# Patient Record
Sex: Male | Born: 1970 | Race: White | Hispanic: No | Marital: Single | Smoking: Current every day smoker
Health system: Southern US, Community
[De-identification: ages and names within clinical notes are randomized; demographics above are authoritative.]

## PROBLEM LIST (undated history)

## (undated) DIAGNOSIS — Z21 Asymptomatic human immunodeficiency virus [HIV] infection status: Secondary | ICD-10-CM

## (undated) DIAGNOSIS — R011 Cardiac murmur, unspecified: Secondary | ICD-10-CM

## (undated) DIAGNOSIS — E7800 Pure hypercholesterolemia, unspecified: Secondary | ICD-10-CM

## (undated) DIAGNOSIS — B20 Human immunodeficiency virus [HIV] disease: Secondary | ICD-10-CM

## (undated) HISTORY — PX: APPENDECTOMY: SHX54

---

## 2001-11-14 ENCOUNTER — Emergency Department (HOSPITAL_COMMUNITY): Admission: EM | Admit: 2001-11-14 | Discharge: 2001-11-14 | Payer: Self-pay | Admitting: Emergency Medicine

## 2002-08-07 ENCOUNTER — Emergency Department (HOSPITAL_COMMUNITY): Admission: EM | Admit: 2002-08-07 | Discharge: 2002-08-07 | Payer: Self-pay | Admitting: Emergency Medicine

## 2003-02-02 ENCOUNTER — Encounter (INDEPENDENT_AMBULATORY_CARE_PROVIDER_SITE_OTHER): Payer: Self-pay | Admitting: Infectious Diseases

## 2003-02-02 ENCOUNTER — Encounter: Admission: RE | Admit: 2003-02-02 | Discharge: 2003-02-02 | Payer: Self-pay | Admitting: Infectious Diseases

## 2003-02-02 ENCOUNTER — Ambulatory Visit (HOSPITAL_COMMUNITY): Admission: RE | Admit: 2003-02-02 | Discharge: 2003-02-02 | Payer: Self-pay | Admitting: Infectious Diseases

## 2003-02-02 ENCOUNTER — Encounter (INDEPENDENT_AMBULATORY_CARE_PROVIDER_SITE_OTHER): Payer: Self-pay | Admitting: *Deleted

## 2003-02-02 LAB — CONVERTED CEMR LAB
CD4 Count: 200 microliters
CD4 T Cell Abs: 200

## 2003-03-07 ENCOUNTER — Ambulatory Visit (HOSPITAL_COMMUNITY): Admission: RE | Admit: 2003-03-07 | Discharge: 2003-03-07 | Payer: Self-pay | Admitting: Infectious Diseases

## 2003-03-07 ENCOUNTER — Encounter (INDEPENDENT_AMBULATORY_CARE_PROVIDER_SITE_OTHER): Payer: Self-pay | Admitting: Infectious Diseases

## 2003-03-07 ENCOUNTER — Encounter: Admission: RE | Admit: 2003-03-07 | Discharge: 2003-03-07 | Payer: Self-pay | Admitting: Infectious Diseases

## 2003-03-22 ENCOUNTER — Encounter: Admission: RE | Admit: 2003-03-22 | Discharge: 2003-03-22 | Payer: Self-pay | Admitting: Infectious Diseases

## 2003-05-23 ENCOUNTER — Encounter: Admission: RE | Admit: 2003-05-23 | Discharge: 2003-05-23 | Payer: Self-pay | Admitting: Infectious Diseases

## 2003-07-19 ENCOUNTER — Ambulatory Visit (HOSPITAL_COMMUNITY): Admission: RE | Admit: 2003-07-19 | Discharge: 2003-07-19 | Payer: Self-pay | Admitting: Infectious Diseases

## 2003-07-19 ENCOUNTER — Encounter: Admission: RE | Admit: 2003-07-19 | Discharge: 2003-07-19 | Payer: Self-pay | Admitting: Infectious Diseases

## 2003-08-03 ENCOUNTER — Encounter: Admission: RE | Admit: 2003-08-03 | Discharge: 2003-08-03 | Payer: Self-pay | Admitting: Infectious Diseases

## 2003-09-12 ENCOUNTER — Encounter: Admission: RE | Admit: 2003-09-12 | Discharge: 2003-09-12 | Payer: Self-pay | Admitting: Infectious Diseases

## 2003-10-31 ENCOUNTER — Encounter: Admission: RE | Admit: 2003-10-31 | Discharge: 2003-10-31 | Payer: Self-pay | Admitting: Infectious Diseases

## 2003-10-31 ENCOUNTER — Ambulatory Visit (HOSPITAL_COMMUNITY): Admission: RE | Admit: 2003-10-31 | Discharge: 2003-10-31 | Payer: Self-pay | Admitting: Infectious Diseases

## 2003-11-14 ENCOUNTER — Encounter: Admission: RE | Admit: 2003-11-14 | Discharge: 2003-11-14 | Payer: Self-pay | Admitting: Infectious Diseases

## 2004-02-28 ENCOUNTER — Ambulatory Visit (HOSPITAL_COMMUNITY): Admission: RE | Admit: 2004-02-28 | Discharge: 2004-02-28 | Payer: Self-pay | Admitting: Infectious Diseases

## 2004-02-28 ENCOUNTER — Ambulatory Visit: Payer: Self-pay | Admitting: Internal Medicine

## 2004-03-13 ENCOUNTER — Ambulatory Visit: Payer: Self-pay | Admitting: Infectious Diseases

## 2004-06-05 ENCOUNTER — Ambulatory Visit (HOSPITAL_COMMUNITY): Admission: RE | Admit: 2004-06-05 | Discharge: 2004-06-05 | Payer: Self-pay | Admitting: Infectious Diseases

## 2004-06-05 ENCOUNTER — Ambulatory Visit: Payer: Self-pay | Admitting: Infectious Diseases

## 2004-06-19 ENCOUNTER — Ambulatory Visit: Payer: Self-pay | Admitting: Infectious Diseases

## 2004-12-05 ENCOUNTER — Ambulatory Visit: Payer: Self-pay | Admitting: Infectious Diseases

## 2004-12-05 ENCOUNTER — Ambulatory Visit (HOSPITAL_COMMUNITY): Admission: RE | Admit: 2004-12-05 | Discharge: 2004-12-05 | Payer: Self-pay | Admitting: Infectious Diseases

## 2004-12-26 ENCOUNTER — Ambulatory Visit: Payer: Self-pay | Admitting: Infectious Diseases

## 2005-01-09 ENCOUNTER — Ambulatory Visit: Payer: Self-pay | Admitting: Infectious Diseases

## 2005-01-22 ENCOUNTER — Ambulatory Visit: Payer: Self-pay | Admitting: Infectious Diseases

## 2005-04-17 ENCOUNTER — Ambulatory Visit (HOSPITAL_COMMUNITY): Admission: RE | Admit: 2005-04-17 | Discharge: 2005-04-17 | Payer: Self-pay | Admitting: Infectious Diseases

## 2005-04-17 ENCOUNTER — Encounter (INDEPENDENT_AMBULATORY_CARE_PROVIDER_SITE_OTHER): Payer: Self-pay | Admitting: *Deleted

## 2005-04-17 ENCOUNTER — Ambulatory Visit: Payer: Self-pay | Admitting: Infectious Diseases

## 2005-04-17 LAB — CONVERTED CEMR LAB
CD4 Count: 470 microliters
HIV 1 RNA Quant: 49 copies/mL

## 2005-05-06 ENCOUNTER — Ambulatory Visit: Payer: Self-pay | Admitting: Infectious Diseases

## 2005-10-21 ENCOUNTER — Encounter: Admission: RE | Admit: 2005-10-21 | Discharge: 2005-10-21 | Payer: Self-pay | Admitting: Infectious Diseases

## 2005-10-21 ENCOUNTER — Ambulatory Visit: Payer: Self-pay | Admitting: Infectious Diseases

## 2005-10-21 ENCOUNTER — Encounter (INDEPENDENT_AMBULATORY_CARE_PROVIDER_SITE_OTHER): Payer: Self-pay | Admitting: *Deleted

## 2005-10-21 LAB — CONVERTED CEMR LAB
CD4 Count: 350 microliters
HIV 1 RNA Quant: 49 copies/mL

## 2005-11-04 ENCOUNTER — Ambulatory Visit: Payer: Self-pay | Admitting: Infectious Diseases

## 2006-03-04 ENCOUNTER — Encounter: Admission: RE | Admit: 2006-03-04 | Discharge: 2006-03-04 | Payer: Self-pay | Admitting: Infectious Diseases

## 2006-03-04 ENCOUNTER — Encounter (INDEPENDENT_AMBULATORY_CARE_PROVIDER_SITE_OTHER): Payer: Self-pay | Admitting: *Deleted

## 2006-03-04 ENCOUNTER — Ambulatory Visit: Payer: Self-pay | Admitting: Infectious Diseases

## 2006-03-04 LAB — CONVERTED CEMR LAB
ALT: 13 units/L (ref 0–53)
AST: 13 units/L (ref 0–37)
Albumin: 5 g/dL (ref 3.5–5.2)
Alkaline Phosphatase: 61 units/L (ref 39–117)
BUN: 8 mg/dL (ref 6–23)
Basophils Absolute: 0 10*3/uL (ref 0.0–0.1)
Basophils Relative: 1 % (ref 0–1)
CD4 Count: 460 microliters
CO2: 25 meq/L (ref 19–32)
Calcium: 9.4 mg/dL (ref 8.4–10.5)
Chloride: 102 meq/L (ref 96–112)
Cholesterol: 245 mg/dL — ABNORMAL HIGH (ref 0–200)
Creatinine, Ser: 0.98 mg/dL (ref 0.40–1.50)
Eosinophils Relative: 2 % (ref 0–4)
Glucose, Bld: 91 mg/dL (ref 70–99)
HCT: 45.9 % (ref 41.0–49.0)
HDL: 34 mg/dL — ABNORMAL LOW (ref >39)
HIV 1 RNA Quant: 152 copies/mL
HIV 1 RNA Quant: 152 copies/mL — ABNORMAL HIGH (ref <50)
HIV-1 RNA Quant, Log: 2.18 — ABNORMAL HIGH (ref <1.70)
Hemoglobin: 15.5 g/dL (ref 13.9–16.8)
LDL Cholesterol: 157 mg/dL — ABNORMAL HIGH (ref 0–99)
Lymphocytes Relative: 32 % (ref 15–43)
Lymphs Abs: 1.3 10*3/uL (ref 0.8–3.1)
MCHC: 33.8 g/dL (ref 33.1–35.4)
MCV: 92 fL (ref 78.8–100.0)
Monocytes Absolute: 0.5 10*3/uL (ref 0.2–0.7)
Monocytes Relative: 12 % — ABNORMAL HIGH (ref 3–11)
Neutro Abs: 2.2 10*3/uL (ref 1.8–6.8)
Neutrophils Relative %: 54 % (ref 47–77)
Platelets: 128 10*3/uL — ABNORMAL LOW (ref 152–374)
Potassium: 5 meq/L (ref 3.5–5.3)
RBC: 4.99 M/uL (ref 4.20–5.50)
RDW: 13.2 % (ref 11.5–15.3)
Sodium: 136 meq/L (ref 135–145)
Total Bilirubin: 0.5 mg/dL (ref 0.3–1.2)
Total CHOL/HDL Ratio: 7.2
Total Protein: 7.8 g/dL (ref 6.0–8.3)
Triglycerides: 271 mg/dL — ABNORMAL HIGH (ref <150)
VLDL: 54 mg/dL — ABNORMAL HIGH (ref 0–40)
WBC: 4.1 10*3/uL (ref 3.7–10.0)

## 2006-03-19 ENCOUNTER — Ambulatory Visit: Payer: Self-pay | Admitting: Internal Medicine

## 2006-04-14 DIAGNOSIS — E781 Pure hyperglyceridemia: Secondary | ICD-10-CM

## 2006-04-14 DIAGNOSIS — B20 Human immunodeficiency virus [HIV] disease: Secondary | ICD-10-CM

## 2006-04-14 DIAGNOSIS — L509 Urticaria, unspecified: Secondary | ICD-10-CM | POA: Insufficient documentation

## 2006-05-01 ENCOUNTER — Encounter (INDEPENDENT_AMBULATORY_CARE_PROVIDER_SITE_OTHER): Payer: Self-pay | Admitting: Infectious Diseases

## 2006-06-02 ENCOUNTER — Encounter (INDEPENDENT_AMBULATORY_CARE_PROVIDER_SITE_OTHER): Payer: Self-pay | Admitting: *Deleted

## 2006-06-02 LAB — CONVERTED CEMR LAB

## 2006-06-15 ENCOUNTER — Encounter (INDEPENDENT_AMBULATORY_CARE_PROVIDER_SITE_OTHER): Payer: Self-pay | Admitting: *Deleted

## 2006-07-07 ENCOUNTER — Ambulatory Visit: Payer: Self-pay | Admitting: Infectious Diseases

## 2006-07-07 ENCOUNTER — Encounter: Admission: RE | Admit: 2006-07-07 | Discharge: 2006-07-07 | Payer: Self-pay | Admitting: Infectious Diseases

## 2006-07-29 ENCOUNTER — Ambulatory Visit: Payer: Self-pay | Admitting: Infectious Diseases

## 2006-08-04 ENCOUNTER — Ambulatory Visit: Payer: Self-pay | Admitting: Infectious Diseases

## 2006-08-04 LAB — CONVERTED CEMR LAB

## 2006-08-27 ENCOUNTER — Ambulatory Visit: Payer: Self-pay | Admitting: Infectious Diseases

## 2006-12-24 ENCOUNTER — Encounter: Admission: RE | Admit: 2006-12-24 | Discharge: 2006-12-24 | Payer: Self-pay | Admitting: Infectious Disease

## 2006-12-24 ENCOUNTER — Ambulatory Visit: Payer: Self-pay | Admitting: Infectious Disease

## 2006-12-24 ENCOUNTER — Telehealth: Payer: Self-pay | Admitting: Infectious Disease

## 2006-12-24 LAB — CONVERTED CEMR LAB
ALT: 17 units/L (ref 0–53)
AST: 16 units/L (ref 0–37)
Albumin: 5.2 g/dL (ref 3.5–5.2)
Alkaline Phosphatase: 62 units/L (ref 39–117)
BUN: 13 mg/dL (ref 6–23)
Basophils Absolute: 0 10*3/uL (ref 0.0–0.1)
Basophils Relative: 0 % (ref 0–1)
CO2: 23 meq/L (ref 19–32)
Calcium: 9.4 mg/dL (ref 8.4–10.5)
Chloride: 102 meq/L (ref 96–112)
Creatinine, Ser: 0.9 mg/dL (ref 0.40–1.50)
Eosinophils Absolute: 0.1 10*3/uL (ref 0.0–0.7)
Eosinophils Relative: 2 % (ref 0–5)
Glucose, Bld: 96 mg/dL (ref 70–99)
HCT: 45.1 % (ref 39.0–52.0)
HIV 1 RNA Quant: <50 copies/mL (ref <50)
HIV-1 RNA Quant, Log: <1.7 (ref <1.70)
Hemoglobin: 15.7 g/dL (ref 13.0–17.0)
Lymphocytes Relative: 26 % (ref 12–46)
Lymphs Abs: 1.6 10*3/uL (ref 0.7–3.3)
MCHC: 34.8 g/dL (ref 30.0–36.0)
MCV: 90.4 fL (ref 78.0–100.0)
Monocytes Absolute: 0.5 10*3/uL (ref 0.2–0.7)
Monocytes Relative: 7 % (ref 3–11)
Neutro Abs: 4.1 10*3/uL (ref 1.7–7.7)
Neutrophils Relative %: 65 % (ref 43–77)
Platelets: 135 10*3/uL — ABNORMAL LOW (ref 150–400)
Potassium: 4.6 meq/L (ref 3.5–5.3)
RBC: 4.99 M/uL (ref 4.22–5.81)
RDW: 13 % (ref 11.5–14.0)
Sodium: 139 meq/L (ref 135–145)
Total Bilirubin: 0.7 mg/dL (ref 0.3–1.2)
Total Protein: 8 g/dL (ref 6.0–8.3)
WBC: 6.2 10*3/uL (ref 4.0–10.5)

## 2007-01-08 ENCOUNTER — Ambulatory Visit: Payer: Self-pay | Admitting: Infectious Disease

## 2007-01-08 DIAGNOSIS — F17200 Nicotine dependence, unspecified, uncomplicated: Secondary | ICD-10-CM

## 2007-06-01 ENCOUNTER — Telehealth (INDEPENDENT_AMBULATORY_CARE_PROVIDER_SITE_OTHER): Payer: Self-pay | Admitting: *Deleted

## 2007-07-21 ENCOUNTER — Ambulatory Visit: Payer: Self-pay | Admitting: Infectious Disease

## 2007-07-21 ENCOUNTER — Encounter: Admission: RE | Admit: 2007-07-21 | Discharge: 2007-07-21 | Payer: Self-pay | Admitting: Infectious Disease

## 2007-07-21 LAB — CONVERTED CEMR LAB
ALT: 21 units/L (ref 0–53)
AST: 19 units/L (ref 0–37)
Albumin: 5.1 g/dL (ref 3.5–5.2)
Alkaline Phosphatase: 65 units/L (ref 39–117)
BUN: 13 mg/dL (ref 6–23)
Basophils Absolute: 0 10*3/uL (ref 0.0–0.1)
Basophils Relative: 0 % (ref 0–1)
CO2: 22 meq/L (ref 19–32)
Calcium: 10.1 mg/dL (ref 8.4–10.5)
Chloride: 105 meq/L (ref 96–112)
Cholesterol: 275 mg/dL — ABNORMAL HIGH (ref 0–200)
Creatinine, Ser: 0.94 mg/dL (ref 0.40–1.50)
Eosinophils Absolute: 0.1 10*3/uL (ref 0.0–0.7)
Eosinophils Relative: 2 % (ref 0–5)
Glucose, Bld: 94 mg/dL (ref 70–99)
HCT: 44.3 % (ref 39.0–52.0)
HDL: 37 mg/dL — ABNORMAL LOW (ref >39)
HIV 1 RNA Quant: 57 copies/mL — ABNORMAL HIGH (ref <50)
HIV-1 RNA Quant, Log: 1.76 — ABNORMAL HIGH (ref <1.70)
Hemoglobin: 14.9 g/dL (ref 13.0–17.0)
LDL Cholesterol: 179 mg/dL — ABNORMAL HIGH (ref 0–99)
Lymphocytes Relative: 36 % (ref 12–46)
Lymphs Abs: 1.8 10*3/uL (ref 0.7–4.0)
MCHC: 33.6 g/dL (ref 30.0–36.0)
MCV: 91.5 fL (ref 78.0–100.0)
Monocytes Absolute: 0.5 10*3/uL (ref 0.1–1.0)
Monocytes Relative: 11 % (ref 3–12)
Neutro Abs: 2.5 10*3/uL (ref 1.7–7.7)
Neutrophils Relative %: 51 % (ref 43–77)
Platelets: 134 10*3/uL — ABNORMAL LOW (ref 150–400)
Potassium: 5.3 meq/L (ref 3.5–5.3)
RBC: 4.84 M/uL (ref 4.22–5.81)
RDW: 13.2 % (ref 11.5–15.5)
Sodium: 141 meq/L (ref 135–145)
Total Bilirubin: 0.7 mg/dL (ref 0.3–1.2)
Total CHOL/HDL Ratio: 7.4
Total Protein: 7.9 g/dL (ref 6.0–8.3)
Triglycerides: 293 mg/dL — ABNORMAL HIGH (ref <150)
VLDL: 59 mg/dL — ABNORMAL HIGH (ref 0–40)
WBC: 4.9 10*3/uL (ref 4.0–10.5)

## 2007-08-03 ENCOUNTER — Ambulatory Visit: Payer: Self-pay | Admitting: Infectious Disease

## 2007-10-27 ENCOUNTER — Telehealth: Payer: Self-pay | Admitting: Infectious Disease

## 2007-10-29 ENCOUNTER — Telehealth (INDEPENDENT_AMBULATORY_CARE_PROVIDER_SITE_OTHER): Payer: Self-pay | Admitting: *Deleted

## 2007-12-31 ENCOUNTER — Ambulatory Visit: Payer: Self-pay | Admitting: Infectious Disease

## 2007-12-31 LAB — CONVERTED CEMR LAB
ALT: 14 units/L (ref 0–53)
AST: 16 units/L (ref 0–37)
Albumin: 4.8 g/dL (ref 3.5–5.2)
Alkaline Phosphatase: 53 units/L (ref 39–117)
BUN: 10 mg/dL (ref 6–23)
Basophils Absolute: 0 10*3/uL (ref 0.0–0.1)
Basophils Relative: 0 % (ref 0–1)
CO2: 23 meq/L (ref 19–32)
Calcium: 8.9 mg/dL (ref 8.4–10.5)
Chloride: 104 meq/L (ref 96–112)
Cholesterol: 226 mg/dL — ABNORMAL HIGH (ref 0–200)
Creatinine, Ser: 0.92 mg/dL (ref 0.40–1.50)
Eosinophils Absolute: 0.1 10*3/uL (ref 0.0–0.7)
Eosinophils Relative: 1 % (ref 0–5)
Glucose, Bld: 96 mg/dL (ref 70–99)
HCT: 44.3 % (ref 39.0–52.0)
HDL: 32 mg/dL — ABNORMAL LOW (ref >39)
HIV 1 RNA Quant: <50 copies/mL (ref <50)
HIV-1 RNA Quant, Log: <1.7 (ref <1.70)
Hemoglobin: 14.5 g/dL (ref 13.0–17.0)
Lymphocytes Relative: 18 % (ref 12–46)
Lymphs Abs: 1.5 10*3/uL (ref 0.7–4.0)
MCHC: 32.7 g/dL (ref 30.0–36.0)
MCV: 91.9 fL (ref 78.0–100.0)
Monocytes Absolute: 0.6 10*3/uL (ref 0.1–1.0)
Monocytes Relative: 7 % (ref 3–12)
Neutro Abs: 6 10*3/uL (ref 1.7–7.7)
Neutrophils Relative %: 74 % (ref 43–77)
Platelets: 118 10*3/uL — ABNORMAL LOW (ref 150–400)
Potassium: 4.6 meq/L (ref 3.5–5.3)
RBC: 4.82 M/uL (ref 4.22–5.81)
RDW: 13.6 % (ref 11.5–15.5)
Sodium: 138 meq/L (ref 135–145)
Total Bilirubin: 0.6 mg/dL (ref 0.3–1.2)
Total CHOL/HDL Ratio: 7.1
Total Protein: 7.5 g/dL (ref 6.0–8.3)
Triglycerides: 782 mg/dL — ABNORMAL HIGH (ref <150)
WBC: 8.1 10*3/uL (ref 4.0–10.5)

## 2008-01-14 ENCOUNTER — Ambulatory Visit: Payer: Self-pay | Admitting: Infectious Disease

## 2008-01-14 DIAGNOSIS — K219 Gastro-esophageal reflux disease without esophagitis: Secondary | ICD-10-CM

## 2008-02-06 ENCOUNTER — Emergency Department (HOSPITAL_BASED_OUTPATIENT_CLINIC_OR_DEPARTMENT_OTHER): Admission: EM | Admit: 2008-02-06 | Discharge: 2008-02-06 | Payer: Self-pay | Admitting: Emergency Medicine

## 2008-02-08 ENCOUNTER — Telehealth: Payer: Self-pay | Admitting: Infectious Disease

## 2008-02-23 ENCOUNTER — Ambulatory Visit: Payer: Self-pay | Admitting: Cardiology

## 2008-03-01 ENCOUNTER — Ambulatory Visit: Payer: Self-pay | Admitting: Infectious Disease

## 2008-03-01 LAB — CONVERTED CEMR LAB
ALT: 14 units/L (ref 0–53)
AST: 14 units/L (ref 0–37)
Albumin: 4.4 g/dL (ref 3.5–5.2)
Alkaline Phosphatase: 56 units/L (ref 39–117)
BUN: 9 mg/dL (ref 6–23)
Basophils Absolute: 0 10*3/uL (ref 0.0–0.1)
Basophils Relative: 0 % (ref 0–1)
CO2: 20 meq/L (ref 19–32)
Calcium: 8.5 mg/dL (ref 8.4–10.5)
Chlamydia, Swab/Urine, PCR: NEGATIVE
Chloride: 101 meq/L (ref 96–112)
Cholesterol: 195 mg/dL (ref 0–200)
Creatinine, Ser: 0.8 mg/dL (ref 0.40–1.50)
Eosinophils Absolute: 0.1 10*3/uL (ref 0.0–0.7)
Eosinophils Relative: 1 % (ref 0–5)
GC Probe Amp, Urine: NEGATIVE
Glucose, Bld: 111 mg/dL — ABNORMAL HIGH (ref 70–99)
HCT: 41.9 % (ref 39.0–52.0)
HDL: 32 mg/dL — ABNORMAL LOW (ref >39)
HIV 1 RNA Quant: 302 copies/mL — ABNORMAL HIGH (ref <48)
HIV-1 RNA Quant, Log: 2.48 — ABNORMAL HIGH (ref <1.68)
Hemoglobin: 13.6 g/dL (ref 13.0–17.0)
Lymphocytes Relative: 21 % (ref 12–46)
Lymphs Abs: 1.5 10*3/uL (ref 0.7–4.0)
MCHC: 32.5 g/dL (ref 30.0–36.0)
MCV: 93.9 fL (ref 78.0–100.0)
Monocytes Absolute: 0.4 10*3/uL (ref 0.1–1.0)
Monocytes Relative: 6 % (ref 3–12)
Neutro Abs: 4.9 10*3/uL (ref 1.7–7.7)
Neutrophils Relative %: 71 % (ref 43–77)
Platelets: 113 10*3/uL — ABNORMAL LOW (ref 150–400)
Potassium: 4.2 meq/L (ref 3.5–5.3)
RBC: 4.46 M/uL (ref 4.22–5.81)
RDW: 13.2 % (ref 11.5–15.5)
Sodium: 135 meq/L (ref 135–145)
Total Bilirubin: 0.4 mg/dL (ref 0.3–1.2)
Total CHOL/HDL Ratio: 6.1
Total Protein: 6.9 g/dL (ref 6.0–8.3)
Triglycerides: 544 mg/dL — ABNORMAL HIGH (ref <150)
WBC: 6.9 10*3/uL (ref 4.0–10.5)

## 2008-03-17 ENCOUNTER — Ambulatory Visit: Payer: Self-pay | Admitting: Infectious Disease

## 2008-03-17 DIAGNOSIS — D696 Thrombocytopenia, unspecified: Secondary | ICD-10-CM

## 2008-05-31 ENCOUNTER — Ambulatory Visit: Payer: Self-pay | Admitting: Infectious Disease

## 2008-05-31 LAB — CONVERTED CEMR LAB
ALT: 16 units/L (ref 0–53)
AST: 12 units/L (ref 0–37)
Albumin: 4.5 g/dL (ref 3.5–5.2)
Alkaline Phosphatase: 49 units/L (ref 39–117)
BUN: 9 mg/dL (ref 6–23)
Basophils Absolute: 0 10*3/uL (ref 0.0–0.1)
Basophils Relative: 0 % (ref 0–1)
CO2: 20 meq/L (ref 19–32)
Calcium: 8.8 mg/dL (ref 8.4–10.5)
Chloride: 103 meq/L (ref 96–112)
Cholesterol: 196 mg/dL (ref 0–200)
Creatinine, Ser: 0.79 mg/dL (ref 0.40–1.50)
Direct LDL: 96 mg/dL
Eosinophils Absolute: 0.1 10*3/uL (ref 0.0–0.7)
Eosinophils Relative: 1 % (ref 0–5)
Glucose, Bld: 88 mg/dL (ref 70–99)
HCT: 40.6 % (ref 39.0–52.0)
HDL: 34 mg/dL — ABNORMAL LOW (ref >39)
HIV 1 RNA Quant: 85 copies/mL — ABNORMAL HIGH (ref <48)
HIV-1 RNA Quant, Log: 1.93 — ABNORMAL HIGH (ref <1.68)
Hemoglobin: 14.1 g/dL (ref 13.0–17.0)
LDL Cholesterol: 91 mg/dL (ref 0–99)
Lymphocytes Relative: 26 % (ref 12–46)
Lymphs Abs: 1.6 10*3/uL (ref 0.7–4.0)
MCHC: 34.7 g/dL (ref 30.0–36.0)
MCV: 90 fL (ref 78.0–100.0)
Monocytes Absolute: 0.4 10*3/uL (ref 0.1–1.0)
Monocytes Relative: 7 % (ref 3–12)
Neutro Abs: 4 10*3/uL (ref 1.7–7.7)
Neutrophils Relative %: 65 % (ref 43–77)
Platelets: 111 10*3/uL — ABNORMAL LOW (ref 150–400)
Potassium: 4.3 meq/L (ref 3.5–5.3)
RBC: 4.51 M/uL (ref 4.22–5.81)
RDW: 13.4 % (ref 11.5–15.5)
Sodium: 138 meq/L (ref 135–145)
Total Bilirubin: 0.4 mg/dL (ref 0.3–1.2)
Total CHOL/HDL Ratio: 5.8
Total Protein: 7.2 g/dL (ref 6.0–8.3)
Triglycerides: 353 mg/dL — ABNORMAL HIGH (ref <150)
VLDL: 71 mg/dL — ABNORMAL HIGH (ref 0–40)
WBC: 6.2 10*3/uL (ref 4.0–10.5)

## 2008-06-01 ENCOUNTER — Encounter: Payer: Self-pay | Admitting: Infectious Disease

## 2008-06-14 ENCOUNTER — Ambulatory Visit: Payer: Self-pay | Admitting: Infectious Disease

## 2008-12-06 ENCOUNTER — Ambulatory Visit: Payer: Self-pay | Admitting: Infectious Disease

## 2008-12-06 LAB — CONVERTED CEMR LAB
ALT: 15 units/L (ref 0–53)
AST: 15 units/L (ref 0–37)
Albumin: 4.4 g/dL (ref 3.5–5.2)
Alkaline Phosphatase: 49 units/L (ref 39–117)
BUN: 9 mg/dL (ref 6–23)
Basophils Absolute: 0 10*3/uL (ref 0.0–0.1)
Basophils Relative: 0 % (ref 0–1)
CO2: 24 meq/L (ref 19–32)
Calcium: 8.9 mg/dL (ref 8.4–10.5)
Chloride: 102 meq/L (ref 96–112)
Cholesterol: 192 mg/dL (ref 0–200)
Creatinine, Ser: 0.86 mg/dL (ref 0.40–1.50)
Eosinophils Absolute: 0.1 10*3/uL (ref 0.0–0.7)
Eosinophils Relative: 1 % (ref 0–5)
Glucose, Bld: 102 mg/dL — ABNORMAL HIGH (ref 70–99)
HCT: 40.2 % (ref 39.0–52.0)
HDL: 31 mg/dL — ABNORMAL LOW (ref >39)
HIV 1 RNA Quant: 223 copies/mL — ABNORMAL HIGH (ref <48)
HIV-1 RNA Quant, Log: 2.35 — ABNORMAL HIGH (ref <1.68)
Hemoglobin: 13.7 g/dL (ref 13.0–17.0)
Lymphocytes Relative: 34 % (ref 12–46)
Lymphs Abs: 1.8 10*3/uL (ref 0.7–4.0)
MCHC: 34.1 g/dL (ref 30.0–36.0)
MCV: 92.6 fL (ref >=78.0)
Monocytes Absolute: 0.4 10*3/uL (ref 0.1–1.0)
Monocytes Relative: 7 % (ref 3–12)
Neutro Abs: 3.1 10*3/uL (ref 1.7–7.7)
Neutrophils Relative %: 58 % (ref 43–77)
Platelets: 123 10*3/uL — ABNORMAL LOW (ref 150–400)
Potassium: 4.2 meq/L (ref 3.5–5.3)
RBC: 4.34 M/uL (ref 4.22–5.81)
RDW: 13 % (ref 11.5–15.5)
Sodium: 138 meq/L (ref 135–145)
Total Bilirubin: 0.6 mg/dL (ref 0.3–1.2)
Total CHOL/HDL Ratio: 6.2
Total Protein: 7 g/dL (ref 6.0–8.3)
Triglycerides: 626 mg/dL — ABNORMAL HIGH (ref <150)
WBC: 5.4 10*3/uL (ref 4.0–10.5)

## 2008-12-20 ENCOUNTER — Ambulatory Visit: Payer: Self-pay | Admitting: Infectious Disease

## 2008-12-20 LAB — CONVERTED CEMR LAB
Cholesterol, target level: 200 mg/dL
HDL goal, serum: 40 mg/dL
LDL Goal: 70 mg/dL

## 2008-12-26 ENCOUNTER — Ambulatory Visit: Payer: Self-pay | Admitting: Cardiology

## 2009-02-28 ENCOUNTER — Ambulatory Visit: Payer: Self-pay | Admitting: Cardiovascular Disease

## 2009-02-28 LAB — CONVERTED CEMR LAB
ALT: 20 units/L (ref 0–53)
AST: 21 units/L (ref 0–37)
Albumin: 4.1 g/dL (ref 3.5–5.2)
Alkaline Phosphatase: 45 units/L (ref 39–117)
Bilirubin, Direct: 0 mg/dL (ref 0.0–0.3)
Cholesterol: 197 mg/dL (ref 0–200)
Direct LDL: 104.9 mg/dL
HDL: 33.2 mg/dL — ABNORMAL LOW (ref >39.00)
Total Bilirubin: 0.9 mg/dL (ref 0.3–1.2)
Total CHOL/HDL Ratio: 6
Total Protein: 7.2 g/dL (ref 6.0–8.3)
Triglycerides: 294 mg/dL — ABNORMAL HIGH (ref 0.0–149.0)
VLDL: 58.8 mg/dL — ABNORMAL HIGH (ref 0.0–40.0)

## 2009-03-06 ENCOUNTER — Ambulatory Visit: Payer: Self-pay | Admitting: Cardiology

## 2009-05-10 ENCOUNTER — Encounter (INDEPENDENT_AMBULATORY_CARE_PROVIDER_SITE_OTHER): Payer: Self-pay | Admitting: *Deleted

## 2009-05-23 ENCOUNTER — Ambulatory Visit: Payer: Self-pay | Admitting: Infectious Disease

## 2009-05-23 LAB — CONVERTED CEMR LAB
ALT: 18 units/L (ref 0–53)
AST: 16 units/L (ref 0–37)
Albumin: 4.5 g/dL (ref 3.5–5.2)
Alkaline Phosphatase: 49 units/L (ref 39–117)
BUN: 14 mg/dL (ref 6–23)
Basophils Absolute: 0 10*3/uL (ref 0.0–0.1)
Basophils Relative: 0 % (ref 0–1)
CO2: 24 meq/L (ref 19–32)
Calcium: 9.4 mg/dL (ref 8.4–10.5)
Chlamydia, Swab/Urine, PCR: NEGATIVE
Chloride: 104 meq/L (ref 96–112)
Creatinine, Ser: 0.83 mg/dL (ref 0.40–1.50)
Eosinophils Absolute: 0.1 10*3/uL (ref 0.0–0.7)
Eosinophils Relative: 1 % (ref 0–5)
GC Probe Amp, Urine: NEGATIVE
Glucose, Bld: 100 mg/dL — ABNORMAL HIGH (ref 70–99)
HCT: 42.3 % (ref 39.0–52.0)
HIV 1 RNA Quant: 190 copies/mL — ABNORMAL HIGH (ref <48)
HIV-1 RNA Quant, Log: 2.28 — ABNORMAL HIGH (ref <1.68)
Hemoglobin: 14.2 g/dL (ref 13.0–17.0)
Lymphocytes Relative: 36 % (ref 12–46)
Lymphs Abs: 1.9 10*3/uL (ref 0.7–4.0)
MCHC: 33.6 g/dL (ref 30.0–36.0)
MCV: 93.8 fL (ref >=78.0)
Monocytes Absolute: 0.4 10*3/uL (ref 0.1–1.0)
Monocytes Relative: 7 % (ref 3–12)
Neutro Abs: 3 10*3/uL (ref 1.7–7.7)
Neutrophils Relative %: 56 % (ref 43–77)
Platelets: 98 10*3/uL — ABNORMAL LOW (ref 150–400)
Potassium: 4.4 meq/L (ref 3.5–5.3)
RBC: 4.51 M/uL (ref 4.22–5.81)
RDW: 13.2 % (ref 11.5–15.5)
Sodium: 139 meq/L (ref 135–145)
Total Bilirubin: 0.6 mg/dL (ref 0.3–1.2)
Total Protein: 7 g/dL (ref 6.0–8.3)
WBC: 5.3 10*3/uL (ref 4.0–10.5)

## 2009-06-05 ENCOUNTER — Ambulatory Visit: Payer: Self-pay | Admitting: Infectious Disease

## 2009-06-05 LAB — CONVERTED CEMR LAB
Cholesterol, target level: 200 mg/dL
HDL goal, serum: 40 mg/dL
LDL Goal: 130 mg/dL

## 2009-08-22 ENCOUNTER — Ambulatory Visit: Payer: Self-pay | Admitting: Infectious Disease

## 2009-08-22 LAB — CONVERTED CEMR LAB
ALT: 14 units/L (ref 0–53)
AST: 14 units/L (ref 0–37)
Albumin: 4.4 g/dL (ref 3.5–5.2)
Alkaline Phosphatase: 45 units/L (ref 39–117)
BUN: 8 mg/dL (ref 6–23)
Basophils Absolute: 0 10*3/uL (ref 0.0–0.1)
Basophils Relative: 0 % (ref 0–1)
CO2: 25 meq/L (ref 19–32)
Calcium: 8.8 mg/dL (ref 8.4–10.5)
Chloride: 105 meq/L (ref 96–112)
Cholesterol: 171 mg/dL (ref 0–200)
Creatinine, Ser: 0.8 mg/dL (ref 0.40–1.50)
Eosinophils Absolute: 0 10*3/uL (ref 0.0–0.7)
Eosinophils Relative: 1 % (ref 0–5)
Glucose, Bld: 102 mg/dL — ABNORMAL HIGH (ref 70–99)
HCT: 43.2 % (ref 39.0–52.0)
HDL: 36 mg/dL — ABNORMAL LOW (ref >39)
HIV 1 RNA Quant: <48 copies/mL (ref <48)
HIV-1 RNA Quant, Log: <1.68 (ref <1.68)
Hemoglobin: 14.1 g/dL (ref 13.0–17.0)
LDL Cholesterol: 104 mg/dL — ABNORMAL HIGH (ref 0–99)
Lymphocytes Relative: 31 % (ref 12–46)
Lymphs Abs: 1.5 10*3/uL (ref 0.7–4.0)
MCHC: 32.6 g/dL (ref 30.0–36.0)
MCV: 94.1 fL (ref 78.0–100.0)
Monocytes Absolute: 0.3 10*3/uL (ref 0.1–1.0)
Monocytes Relative: 5 % (ref 3–12)
Neutro Abs: 3.1 10*3/uL (ref 1.7–7.7)
Neutrophils Relative %: 63 % (ref 43–77)
Platelets: 117 10*3/uL — ABNORMAL LOW (ref 150–400)
Potassium: 4.6 meq/L (ref 3.5–5.3)
RBC: 4.59 M/uL (ref 4.22–5.81)
RDW: 13.4 % (ref 11.5–15.5)
Sodium: 138 meq/L (ref 135–145)
Total Bilirubin: 0.5 mg/dL (ref 0.3–1.2)
Total CHOL/HDL Ratio: 4.8
Total Protein: 6.9 g/dL (ref 6.0–8.3)
Triglycerides: 156 mg/dL — ABNORMAL HIGH (ref <150)
VLDL: 31 mg/dL (ref 0–40)
WBC: 4.9 10*3/uL (ref 4.0–10.5)

## 2009-09-11 ENCOUNTER — Ambulatory Visit: Payer: Self-pay | Admitting: Infectious Disease

## 2010-02-26 ENCOUNTER — Encounter (INDEPENDENT_AMBULATORY_CARE_PROVIDER_SITE_OTHER): Payer: Self-pay | Admitting: *Deleted

## 2010-04-18 ENCOUNTER — Encounter: Payer: Self-pay | Admitting: Infectious Disease

## 2010-04-18 ENCOUNTER — Ambulatory Visit
Admission: RE | Admit: 2010-04-18 | Discharge: 2010-04-18 | Payer: Self-pay | Source: Home / Self Care | Attending: Infectious Disease | Admitting: Infectious Disease

## 2010-04-18 LAB — CONVERTED CEMR LAB
ALT: 28 units/L (ref 0–53)
AST: 18 units/L (ref 0–37)
Albumin: 4.9 g/dL (ref 3.5–5.2)
Alkaline Phosphatase: 50 units/L (ref 39–117)
BUN: 12 mg/dL (ref 6–23)
Basophils Absolute: 0 10*3/uL (ref 0.0–0.1)
Basophils Relative: 0 % (ref 0–1)
CO2: 25 meq/L (ref 19–32)
Calcium: 9.5 mg/dL (ref 8.4–10.5)
Chloride: 105 meq/L (ref 96–112)
Cholesterol: 196 mg/dL (ref 0–200)
Creatinine, Ser: 0.83 mg/dL (ref 0.40–1.50)
Eosinophils Absolute: 0.1 10*3/uL (ref 0.0–0.7)
Eosinophils Relative: 1 % (ref 0–5)
Glucose, Bld: 110 mg/dL — ABNORMAL HIGH (ref 70–99)
HCT: 42.5 % (ref 39.0–52.0)
HDL: 34 mg/dL — ABNORMAL LOW (ref >39)
HIV 1 RNA Quant: <20 copies/mL (ref <20)
HIV-1 RNA Quant, Log: <1.3 (ref <1.30)
Hemoglobin: 14.1 g/dL (ref 13.0–17.0)
LDL Cholesterol: 108 mg/dL — ABNORMAL HIGH (ref 0–99)
Lymphocytes Relative: 28 % (ref 12–46)
Lymphs Abs: 1.9 10*3/uL (ref 0.7–4.0)
MCHC: 33.2 g/dL (ref 30.0–36.0)
MCV: 96.6 fL (ref 78.0–100.0)
Monocytes Absolute: 0.5 10*3/uL (ref 0.1–1.0)
Monocytes Relative: 7 % (ref 3–12)
Neutro Abs: 4.4 10*3/uL (ref 1.7–7.7)
Neutrophils Relative %: 64 % (ref 43–77)
Platelets: 119 10*3/uL — ABNORMAL LOW (ref 150–400)
Potassium: 4.9 meq/L (ref 3.5–5.3)
RBC: 4.4 M/uL (ref 4.22–5.81)
RDW: 13.2 % (ref 11.5–15.5)
Sodium: 139 meq/L (ref 135–145)
Total Bilirubin: 0.7 mg/dL (ref 0.3–1.2)
Total CHOL/HDL Ratio: 5.8
Total Protein: 7.3 g/dL (ref 6.0–8.3)
Triglycerides: 270 mg/dL — ABNORMAL HIGH (ref <150)
VLDL: 54 mg/dL — ABNORMAL HIGH (ref 0–40)
WBC: 6.8 10*3/uL (ref 4.0–10.5)

## 2010-04-23 LAB — T-HELPER CELL (CD4) - (RCID CLINIC ONLY)
CD4 % Helper T Cell: 41 % (ref 33–55)
CD4 T Cell Abs: 740 uL (ref 400–2700)

## 2010-05-02 ENCOUNTER — Ambulatory Visit
Admission: RE | Admit: 2010-05-02 | Discharge: 2010-05-02 | Payer: Self-pay | Source: Home / Self Care | Attending: Infectious Disease | Admitting: Infectious Disease

## 2010-05-06 LAB — CONVERTED CEMR LAB
ALT: 19 units/L (ref 0–53)
AST: 18 units/L (ref 0–37)
Albumin: 5.2 g/dL (ref 3.5–5.2)
Alkaline Phosphatase: 85 units/L (ref 39–117)
BUN: 11 mg/dL (ref 6–23)
Basophils Absolute: 0 10*3/uL (ref 0.0–0.1)
Basophils Relative: 0 % (ref 0–1)
CD4 Count: 540 microliters
CO2: 22 meq/L (ref 19–32)
Calcium: 9.5 mg/dL (ref 8.4–10.5)
Chloride: 102 meq/L (ref 96–112)
Cholesterol: 230 mg/dL — ABNORMAL HIGH (ref 0–200)
Creatinine, Ser: 0.98 mg/dL (ref 0.40–1.50)
Eosinophils Absolute: 0.1 10*3/uL (ref 0.0–0.7)
Eosinophils Relative: 2 % (ref 0–5)
Glucose, Bld: 97 mg/dL (ref 70–99)
HCT: 44.6 % (ref 39.0–52.0)
HDL: 32 mg/dL — ABNORMAL LOW (ref >39)
HIV 1 RNA Quant: 554 copies/mL — ABNORMAL HIGH (ref <50)
HIV-1 RNA Quant, Log: 2.74 — ABNORMAL HIGH (ref <1.70)
Hemoglobin: 15.2 g/dL (ref 13.0–17.0)
LDL Cholesterol: 138 mg/dL — ABNORMAL HIGH (ref 0–99)
Lymphocytes Relative: 28 % (ref 12–46)
Lymphs Abs: 1.5 10*3/uL (ref 0.7–3.3)
MCHC: 34.1 g/dL (ref 30.0–36.0)
MCV: 92.7 fL (ref 78.0–100.0)
Monocytes Absolute: 0.5 10*3/uL (ref 0.2–0.7)
Monocytes Relative: 10 % (ref 3–11)
Neutro Abs: 3.1 10*3/uL (ref 1.7–7.7)
Neutrophils Relative %: 59 % (ref 43–77)
Platelets: 124 10*3/uL — ABNORMAL LOW (ref 150–400)
Potassium: 4.6 meq/L (ref 3.5–5.3)
RBC: 4.81 M/uL (ref 4.22–5.81)
RDW: 13.3 % (ref 11.5–14.0)
Sodium: 138 meq/L (ref 135–145)
Total Bilirubin: 0.6 mg/dL (ref 0.3–1.2)
Total CHOL/HDL Ratio: 7.2
Total Protein: 8 g/dL (ref 6.0–8.3)
Triglycerides: 298 mg/dL — ABNORMAL HIGH (ref <150)
VLDL: 60 mg/dL — ABNORMAL HIGH (ref 0–40)
WBC: 5.3 10*3/uL (ref 4.0–10.5)

## 2010-05-08 NOTE — Miscellaneous (Signed)
Summary: RW Update  Clinical Lists Changes  Observations: Added new observation of HIV STATUS: CDC-defined AIDS (05/10/2009 16:25)

## 2010-05-08 NOTE — Assessment & Plan Note (Signed)
Summary: 2 MONTH F/U/VS   Visit Type:  Follow-up Primary Provider:  Paulette Blanch Dam MD  CC:  f/u labwork and Lipid Management.  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 He and his partner who also smokes are making a concerted effort to stop smoking and to increase their exercise regimen.  Lipid Management History:      Positive NCEP/ATP III risk factors include HDL cholesterol less than 40 and current tobacco user.  Negative NCEP/ATP III risk factors include male age less than 38 years old, non-diabetic, no family history for ischemic heart disease, non-hypertensive, no ASHD (atherosclerotic heart disease), no prior stroke/TIA, no peripheral vascular disease, and no history of aortic aneurysm.    Problems Prior to Update: 1)  Thrombocytopenia  (ICD-287.5) 2)  Gerd  (ICD-530.81) 3)  Preventive Health Care  (ICD-V70.0) 4)  Cigarette Smoker  (ICD-305.1) 5)  HIV Infection  (ICD-042) 6)  Hypertriglyceridemia  (ICD-272.1) 7)  Gilbert's Syndrome  (ICD-277.4) 8)  Urticaria  (ICD-708.9)  Medications Prior to Update: 1)  Omeprazole 20 Mg Tbec (Omeprazole) .... Take 1 Tablet By Mouth Once A Day 2)  Niaspan 1000 Mg Cr-Tabs (Niacin (Antihyperlipidemic)) .... Take 2 Tablet By Mouth Once A Day 3)  Anacin 81 Mg Tbec (Aspirin) .... Take 1 Tablet By Mouth Once A Day 4)  Fish Oil 1200 Mg Caps (Omega-3 Fatty Acids) .... Take 3 Capsules By Mouth Daily 5)  Atripla 600-200-300 Mg Tabs (Efavirenz-Emtricitab-Tenofovir) .... Take 1 Tablet By Mouth At Bedtime  Current Medications (verified): 1)  Omeprazole 20 Mg Tbec (Omeprazole) .... Take 1 Tablet By Mouth Once A Day 2)  Niaspan 1000 Mg Cr-Tabs (Niacin (Antihyperlipidemic)) .... Take 2 Tablet By Mouth Once A Day 3)  Anacin 81 Mg Tbec (Aspirin) .... Take 1 Tablet By Mouth Once A Day 4)  Fish Oil  1200 Mg Caps (Omega-3 Fatty Acids) .... Take 3 Capsules By Mouth Daily 5)  Atripla 600-200-300 Mg Tabs (Efavirenz-Emtricitab-Tenofovir) .... Take 1 Tablet By Mouth At Bedtime  Allergies (verified): No Known Drug Allergies     Current Allergies (reviewed today): No known allergies  Past History:  Past Medical History: Last updated: 12/20/2008 Uticaria Gilbert's disease Triglyceridemia HIV infection GERD? Thrombocytopenia  Past Surgical History: Last updated: 06/05/2009 none  Family History: Last updated: 01/08/2007 No history of early CAD  Social History: Last updated: 03/17/2008 Domestic Partner smoker  Risk Factors: Alcohol Use: occassionally (06/05/2009) Caffeine Use: 2 cups of coffee in the am (06/05/2009) Exercise: yes (06/05/2009)  Risk Factors: Smoking Status: current (06/05/2009) Packs/Day: 0.75 (06/05/2009)  Family History: Reviewed history from 01/08/2007 and no changes required. No history of early CAD  Social History: Reviewed history from 03/17/2008 and no changes required. Domestic Partner smoker  Review of Systems  The patient denies anorexia, fever, weight loss, weight gain, vision loss, decreased hearing, hoarseness, chest pain, syncope, dyspnea on exertion, peripheral edema, prolonged cough, headaches, hemoptysis, abdominal pain, melena, hematochezia, severe indigestion/heartburn, hematuria, incontinence, genital sores, muscle weakness, suspicious skin lesions, transient blindness, difficulty walking, depression, unusual weight change, abnormal bleeding, and enlarged lymph nodes.    Vital Signs:  Patient profile:   40 year old male Height:      69 inches (175.26 cm) Weight:      188.75 pounds (85.80 kg) BMI:     27.97 Temp:     98.7 degrees  F (37.06 degrees C) oral Pulse rate:   82 / minute BP sitting:   132 / 85  (left arm)  Vitals Entered By: Starleen Arms CMA (September 11, 2009 9:40 AM) CC: f/u labwork, Lipid Management Is  Patient Diabetic? No Pain Assessment Patient in pain? no      Nutritional Status BMI of 25 - 29 = overweight  Does patient need assistance? Functional Status Self care Ambulation Normal        Medication Adherence: 09/11/2009   Adherence to medications reviewed with patient. Counseling to provide adequate adherence provided   Prevention For Positives: 09/11/2009   Safe sex practices discussed with patient. Condoms offered.   Education Materials Provided: 09/11/2009 Safe sex practices discussed with patient. Condoms offered.                          Physical Exam  General:  alert and well-nourished.   Head:  normocephalic and atraumatic.   Eyes:  vision grossly intact.   Ears:  no external deformities.   Nose:  no external deformity and no external erythema.   Mouth:  good dentition, pharynx pink and moist, no erythema, and no exudates.   Neck:  supple and full ROM.   Lungs:  normal respiratory effort, no crackles, and no wheezes.   Heart:  normal rate, regular rhythm, no murmur, no gallop, and no rub.   Abdomen:  soft and no distention.  no masses.   Msk:  normal ROM.  no joint deformities.   Extremities:  No clubbing, cyanosis, edema, or deformity noted with normal full range of motion of all joints.   Neurologic:  alert & oriented X3.  gait normal.  strength normal in all extremities and gait normal.   Skin:  no rashes.  no petechiae.   Psych:  Oriented X3.  normally interactive and good eye contact.     Impression & Recommendations:  Problem # 1:  HIV INFECTION (ICD-042) Perfect suppresion, cd4 healthy Orders: Est. Patient Level IV (99214)Future Orders: T-CD4SP ([facility]) (CD4SP) ... 03/10/2010 T-HIV Viral Load [phone]) ... 03/10/2010 T-CBC w/Diff ([id number]) ... 03/10/2010 T-RPR (Syphilis) ([phone]) ... 03/10/2010 T-Lipid Profile [id number]) ... 03/10/2010 T-Comprehensive Metabolic Panel [id number]) ... 03/10/2010  Diagnostics Reviewed:    HIV: CDC-defined AIDS (05/10/2009)   CD4: 610 (08/23/2009)   WBC: 4.9 (08/22/2009)   Hgb: 14.1 (08/22/2009)   HCT: 43.2 (08/22/2009)   Platelets: 117 (08/22/2009) HIV genotype: REPORT (08/04/2006)   HIV-1 RNA: <48 copies/mL (08/22/2009)   HBSAg: NO (06/02/2006)  Problem # 2:  HYPERTRIGLYCERIDEMIA (ICD-272.1)  really impressed with her improvement His updated medication list for this problem includes:    Niaspan 1000 Mg Cr-tabs (Niacin (antihyperlipidemic)) .Marland Kitchen... Take 2 tablet by mouth once a day  Orders: Est. Patient Level IV (02725)  Problem # 3:  CIGARETTE SMOKER (ICD-305.1)  he is going to use nicotine patch  Orders: Est. Patient Level IV (36644)  Problem # 4:  GERD (ICD-530.81)  continue ppi His updated medication list for this problem includes:    Omeprazole 20 Mg Tbec (Omeprazole) .Marland Kitchen... Take 1 tablet by mouth once a day  Orders: Est. Patient Level IV (03474)  Lipid Assessment/Plan:      Based on NCEP/ATP III, the patient's risk factor category is "0-1 risk factors".  The patient's lipid goals are as follows: Total cholesterol goal is 200; LDL cholesterol goal is 130; HDL cholesterol goal is 40; Triglyceride goal is 150.  His LDL  cholesterol goal has been met.    Patient Instructions: 1)  Please schedule a follow-up appointment in 6 months. 2)  come in fasting for your lipids again in December

## 2010-05-08 NOTE — Assessment & Plan Note (Signed)
Summary: est-ck/fu/meds/cfb   Visit Type:  Follow-up Primary Provider:  Paulette Blanch Dam MD  CC:  follow-up visit and Lipid Management.  History of Present Illness: 3 year Caucasian old man with  HIV relatively well controlled on atripla, hyperlipidemia with esp high trigyclerides, on niacin.  I had referred him to [facility] where the pt was seen by Dr. Myrtis Ser and his niacin optimized along with his fish oil. He continues to try to adhere to a "Sales executive" diet though he has not been able to exercise as much as he did in the fall. His TG did fall from 600s in Sept to upper 200s in November. Otherwise he has no complants today.  Lipid Management History:      Positive NCEP/ATP III risk factors include HDL cholesterol less than 40 and current tobacco user.  Negative NCEP/ATP III risk factors include male age less than 64 years old, non-diabetic, no family history for ischemic heart disease, non-hypertensive, no ASHD (atherosclerotic heart disease), no prior stroke/TIA, no peripheral vascular disease, and no history of aortic aneurysm.    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 Take 1 Tablet By Mouth At Bedtime 2)  Omeprazole 20 Mg Tbec (Omeprazole) .... Take 1 Tablet By Mouth Once A Day 3)  Nicoderm Cq 21 Mg/24hr Pt24 (Nicotine) .... One Patch Daily For 6 Weeks Then Patch For 2 Weeks, Then For Two Weeks 4)  Nicoderm Cq 14 Mg/24hr Pt24 (Nicotine) .... Use After Patch, For 2 Weeks, Then For 2 Weeks 5)  Nicoderm Cq 7 Mg/24hr Pt24 (Nicotine) .... After Finisihing and 14 Micrograms  Regimens Take This For 2 Weeks Then Stop 6)  Niaspan 1000 Mg Cr-Tabs (Niacin (Antihyperlipidemic)) .... Take 2 Tablet By Mouth Once A Day 7)  Anacin 81 Mg Tbec (Aspirin) .... Take 1 Tablet By Mouth Once A Day 8)  Fish Oil 1200 Mg Caps (Omega-3 Fatty Acids) .... Take 3 Capsules By Mouth Daily 9)  Atripla 600-200-300 Mg Tabs (Efavirenz-Emtricitab-Tenofovir) .... Take 1 Tablet By Mouth At Bedtime  Current Medications (verified): 1)  Atripla 600-200-300 Mg Tabs (Efavirenz-Emtricitab-Tenofovir) .... Take 1 Tablet By Mouth At Bedtime 2)  Omeprazole 20 Mg Tbec (Omeprazole) .... Take 1 Tablet By Mouth Once A Day 3)  Niaspan 1000 Mg Cr-Tabs (Niacin (Antihyperlipidemic)) .... Take 2 Tablet By Mouth Once A Day 4)  Anacin 81 Mg Tbec (Aspirin) .... Take 1 Tablet By Mouth Once A Day 5)  Fish Oil 1200 Mg Caps (Omega-3 Fatty Acids) .... Take 3 Capsules By Mouth Daily 6)  Atripla 600-200-300 Mg Tabs (Efavirenz-Emtricitab-Tenofovir) .... Take 1 Tablet By Mouth At Bedtime  Allergies (verified): No Known Drug Allergies  mc  Preventive Screening-Counseling & Management  Alcohol-Tobacco     Alcohol drinks/day: occassionally     Alcohol type: wine     Smoking Status: current     Packs/Day: 0.75     Year Started: 2007     Year Quit: 2009  7 weeks  Caffeine-Diet-Exercise     Caffeine use/day: 2 cups of coffee in the am     Does Patient Exercise: yes  Type of exercise: walk     Exercise (avg: min/session): 30-60     Times/week: <3   Current Allergies (reviewed today): No known allergies  Past History:  Past Medical History: Last updated: 12/20/2008 Uticaria Gilbert's disease Triglyceridemia HIV infection GERD? Thrombocytopenia  Family History: Last updated: 01/08/2007 No history of early CAD  Social History: Last updated: 03/17/2008 Domestic Partner smoker  Risk Factors: Alcohol Use: occassionally (06/05/2009) Caffeine Use: 2 cups of coffee in the am (06/05/2009) Exercise: yes  (06/05/2009)  Risk Factors: Smoking Status: current (06/05/2009) Packs/Day: 0.75 (06/05/2009)  Past Surgical History: none  Review of Systems  The patient denies anorexia, fever, weight loss, weight gain, vision loss, decreased hearing, hoarseness, chest pain, syncope, dyspnea on exertion, peripheral edema, prolonged cough, headaches, hemoptysis, abdominal pain, melena, hematochezia, severe indigestion/heartburn, hematuria, incontinence, genital sores, muscle weakness, suspicious skin lesions, transient blindness, difficulty walking, depression, unusual weight change, abnormal bleeding, and enlarged lymph nodes.   h  Vital Signs:  Patient profile:   40 year old male Height:      69 inches (175.26 cm) Weight:      187.9 pounds BMI:     27.85 Temp:     98.8 degrees F (37.11 degrees C) oral Pulse rate:   88 / minute BP sitting:   134 / 82  (left arm)  Vitals Entered By: Wendall Mola CMA Duncan Dull) (June 05, 2009 3:28 PM) CC: follow-up visit, Lipid Management Is Patient Diabetic? No Pain Assessment Patient in pain? no      Nutritional Status BMI of 25 - 29 = overweight Nutritional Status Detail appetite normal  Does patient need assistance? Functional Status Self care Ambulation Normal Comments no missed doses of atripla per patient   Physical Exam  General:  alert and well-nourished.   Head:  normocephalic and atraumatic.   Eyes:  vision grossly intact.   Ears:  no external deformities.   Nose:  no external deformity and no external erythema.   Mouth:  good dentition, pharynx pink and moist, no erythema, and no exudates.   Neck:  supple and full ROM.   Lungs:  normal respiratory effort, no crackles, and no wheezes.   Heart:  normal rate, regular rhythm, no murmur, no gallop, and no rub.   Abdomen:  soft and no distention.   Msk:  normal ROM.  no joint deformities.   Extremities:  No clubbing, cyanosis, edema, or deformity noted with normal full range of  motion of all joints.   Neurologic:  alert & oriented X3.  gait normal.   Skin:  no rashes.  no petechiae.   Psych:  Oriented X3.  normally interactive and good eye contact.          Medication Adherence: 06/05/2009   Adherence to medications reviewed with patient. Counseling to provide adequate adherence provided   Prevention For Positives: 06/05/2009   Safe sex practices discussed with patient. Condoms offered.   Education Materials Provided: 06/05/2009 Safe sex practices discussed with patient. Condoms offered.                        s  Impression & Recommendations:  Problem # 1:  HIV INFECTION (ICD-042) reasonable control. Could contemplate change to raltegravir and truvada to lower TGs. Orders: T-RPR (Syphilis) [phone]) Est. Patient Level IV (99214)Future Orders: T-HIV Viral Load ([id number]) ... 08/07/2009 T-Lipid Profile [phone]) ... 08/07/2009 T-Comprehensive Metabolic Panel [phone]) ... 08/07/2009 T-CD4SP ([facility]) (CD4SP) ... 08/07/2009  Diagnostics Reviewed:  HIV: CDC-defined AIDS (05/10/2009)   CD4: 770 (05/24/2009)   WBC: 5.3 (05/23/2009)   Hgb: 14.2 (05/23/2009)   HCT: 42.3 (05/23/2009)   Platelets: 98 (05/23/2009) HIV genotype: REPORT (08/04/2006)   HIV-1 RNA: 190 (05/23/2009)   HBSAg: NO (06/02/2006)  Problem # 2:  HYPERTRIGLYCERIDEMIA (ICD-272.1)  He is on 2 g niaspan as well as large dose of fish oil. At goal for ldl, and nonhdl cholesterol TG still in upper 200s. Recheck in a few months after more exercise and diet efforts. His updated medication list for this problem includes:    Niaspan 1000 Mg Cr-tabs (Niacin (antihyperlipidemic)) .Marland Kitchen... Take 2 tablet by mouth once a day  Orders: Est. Patient Level IV (16109)  Problem # 3:  CIGARETTE SMOKER (ICD-305.1)  counselled to quit, but continues to smoke The following medications were removed from the medication list:    Nicoderm Cq 21 Mg/24hr Pt24 (Nicotine) ..... One patch daily for 6  weeks then patch for 2 weeks, then for two weeks    Nicoderm Cq 14 Mg/24hr Pt24 (Nicotine) ..... Use after patch, for 2 weeks, then for 2 weeks    Nicoderm Cq 7 Mg/24hr Pt24 (Nicotine) .Marland Kitchen... After finisihing and 14 micrograms regimens take this for 2 weeks then stop  Orders: Est. Patient Level IV (60454)  Problem # 4:  GERD (ICD-530.81)  on ppi, could consider change to pepcid in future His updated medication list for this problem includes:    Omeprazole 20 Mg Tbec (Omeprazole) .Marland Kitchen... Take 1 tablet by mouth once a day  Orders: Est. Patient Level IV (09811)  Other Orders: Admin 1st Vaccine (91478) Flu Vaccine 62yrs + (29562) Future Orders: T-CBC w/Diff ([id number]) ... 08/07/2009  Lipid Assessment/Plan:      Based on NCEP/ATP III, the patient's risk factor category is "2 or more risk factors and a calculated 10 year CAD risk of > 20%".  The patient's lipid goals are as follows: Total cholesterol goal is 200; LDL cholesterol goal is 130; HDL cholesterol goal is 40; Triglyceride goal is 150.  His LDL cholesterol goal has been met.    Patient Instructions: 1)  rtc in May   Process Orders Check Orders Results:     [facility]: ABN not required for this insurance Tests Sent for requisitioning (June 05, 2009 10:59 PM):     06/05/2009: [facility] -- T-RPR (Syphilis) [phone] (signed)     08/07/2009: [facility] -- T-HIV Viral Load [phone] (signed)     08/07/2009: [facility] -- T-Lipid Profile [phone] (signed)     08/07/2009: [facility] -- T-Comprehensive Metabolic Panel [[id number]] (signed)     08/07/2009: [facility] -- T-CBC w/Diff [[id number]] (signed)  Flu Vaccine Consent Questions     Do you have a history of severe allergic reactions to this vaccine? no    Any prior history of allergic reactions to egg and/or gelatin? no    Do  you have a sensitivity to the preservative Thimersol? no    Do you have a past history of Guillan-Barre Syndrome? no    Do you currently have an acute febrile illness? no    Have you ever had a severe reaction to latex? no    Vaccine information given and explained to patient? yes    Are you currently pregnant? no    Lot [id number]   Exp Date:07/06/2009   Manufacturer: [employer]    Site Given  Right  Deltoid IM  Tomasita Morrow RN  June 05, 2009 4:19 PM      08/07/2009: [facility] -- T-Lipid Profile [phone] (signed)     08/07/2009: [facility] -- T-Comprehensive Metabolic Panel [[id number]] (signed)     08/07/2009: [facility] -- T-CBC w/Diff [phone] (signed)   .[email or web address]

## 2010-05-08 NOTE — Miscellaneous (Signed)
  Clinical Lists Changes  Observations: Added new observation of YEARAIDSPOS: 2004  (02/26/2010 13:33)

## 2010-05-08 NOTE — Miscellaneous (Signed)
Summary: Lab orders added   Clinical Lists Changes  Orders: Added new Test order of T-Chlamydia  Probe, urine [id number]) - Signed     Process Orders Check Orders Results:     [facility]: ABN not required for this insurance Order queued for requisitioning for [facility]: May 23, 2009 11:43 AM  Tests Sent for requisitioning (May 23, 2009 11:43 AM):     05/23/2009: [facility] -- [employer], urine [phone] (signed)

## 2010-05-10 NOTE — Assessment & Plan Note (Signed)
Summary: f/u [mkj]   Visit Type:  Follow-up Primary Provider:  Paulette Blanch Dam MD  CC:  f/u  and Lipid Management.  History of Present Illness: 40 year Caucasian old man with  HIV perfectly suppressed on atripla,  with hyperlipidemia and high trigyclerides that have also improved on niacin, fish oil and with diet. He had been seen by [facility] and they had suggested adding in a statin for prevention and if TG stayed high. Mr Pilson had not wanted to change to raltegravir and truvada from his [place]. I also proposed once daily etravirine and truvada but he did not want to go for that either. He continues to smoke and is reluctant to to stop. He has no specific complaints today.   Lipid Management History:      Positive NCEP/ATP III risk factors include HDL cholesterol less than 40 and current tobacco user.  Negative NCEP/ATP III risk factors include male age less than 42 years old, non-diabetic, no family history for ischemic heart disease, non-hypertensive, no ASHD (atherosclerotic heart disease), no prior stroke/TIA, no peripheral vascular disease, and no history of aortic aneurysm.    Problems Prior to Update: 1)  Thrombocytopenia  (ICD-287.5) 2)  Gerd  (ICD-530.81) 3)  Preventive Health Care  (ICD-V70.0) 4)  Cigarette Smoker  (ICD-305.1) 5)  HIV Infection  (ICD-042) 6)  Hypertriglyceridemia  (ICD-272.1) 7)  Gilbert's Syndrome  (ICD-277.4) 8)  Urticaria  (ICD-708.9)  Medications Prior to Update: 1)  Omeprazole 20 Mg Tbec (Omeprazole) .... Take 1 Tablet By Mouth Once A Day 2)  Niaspan 1000 Mg Cr-Tabs (Niacin (Antihyperlipidemic)) .... Take 2 Tablet By Mouth Once A Day 3)  Anacin 81 Mg Tbec (Aspirin) .... Take 1 Tablet By Mouth Once A Day 4)  Fish Oil 1200 Mg Caps (Omega-3 Fatty Acids) .... Take 3 Capsules By Mouth Daily 5)  Atripla 600-200-300 Mg Tabs (Efavirenz-Emtricitab-Tenofovir) .... Take 1 Tablet By Mouth At Bedtime  Current Medications (verified): 1)   Omeprazole 20 Mg Tbec (Omeprazole) .... Take 1 Tablet By Mouth Once A Day 2)  Niaspan 1000 Mg Cr-Tabs (Niacin (Antihyperlipidemic)) .... Take 2 Tablet By Mouth Once A Day 3)  Anacin 81 Mg Tbec (Aspirin) .... Take 1 Tablet By Mouth Once A Day 4)  Fish Oil 1200 Mg Caps (Omega-3 Fatty Acids) .... Take 3 Capsules By Mouth Daily 5)  Atripla 600-200-300 Mg Tabs (Efavirenz-Emtricitab-Tenofovir) .... Take 1 Tablet By Mouth At Bedtime 6)  Atorvastatin Calcium 10 Mg Tabs (Atorvastatin Calcium) .... Take 1 Tablet By Mouth Once A Day  Allergies (verified): No Known Drug Allergies     Preventive Screening-Counseling & Management  Alcohol-Tobacco     Alcohol drinks/day: occassionally     Alcohol type: wine     Smoking Status: current     Packs/Day: 0.75     Year Started: 2007     Year Quit: 2009  7 weeks   Current Allergies (reviewed today): No known allergies  Past History:  Past Medical History: Last updated: 12/20/2008 Uticaria Gilbert's disease Triglyceridemia HIV infection GERD? Thrombocytopenia  Past Surgical History: Last updated: 06/05/2009 none  Family History: Last updated: 01/08/2007 No history of early CAD  Social History: Last updated: 03/17/2008 Domestic Partner smoker  Risk Factors: Alcohol Use: occassionally (05/02/2010) Caffeine Use: 2 cups of coffee in the am (06/05/2009) Exercise: yes (06/05/2009)  Risk Factors: Smoking Status: current (05/02/2010) Packs/Day: 0.75 (05/02/2010)  Review of Systems       see HPI otherwise negative on 12 pt  review  Vital Signs:  Patient profile:   40 year old male Height:      69 inches (175.26 cm) Weight:      188 pounds (85.45 kg) BMI:     27.86 Temp:     98.2 degrees F (36.78 degrees C) oral Pulse rate:   93 / minute BP sitting:   142 / 75  (left arm)  Vitals Entered By: Starleen Arms CMA (May 02, 2010 10:08 AM) CC: f/u , Lipid Management Is Patient Diabetic? No Pain Assessment Patient in pain?  no      Nutritional Status BMI of 25 - 29 = overweight Nutritional Status Detail nl  Does patient need assistance? Functional Status Self care Ambulation Normal        Medication Adherence: 05/02/2010   Adherence to medications reviewed with patient. Counseling to provide adequate adherence provided   Prevention For Positives: 05/02/2010   Safe sex practices discussed with patient. Condoms offered.                             Physical Exam  General:  alert and well-nourished.   Head:  normocephalic and atraumatic.   Eyes:  vision grossly intact.   Ears:  no external deformities.   Nose:  no external deformity and no external erythema.   Mouth:  good dentition, pharynx pink and moist, no erythema, and no exudates.   Neck:  supple and full ROM.   Lungs:  normal respiratory effort, no crackles, and no wheezes.   Heart:  normal rate, regular rhythm, no murmur, no gallop, and no rub.   Abdomen:  soft and no distention.  no masses.   Msk:  normal ROM.  no joint deformities.   Extremities:  No clubbing, cyanosis, edema, or deformity noted with normal full range of motion of all joints.   Neurologic:  alert & oriented X3.  gait normal.  strength normal in all extremities and gait normal.   Skin:  no rashes.  no petechiae.   Psych:  Oriented X3.  normally interactive and good eye contact.     Impression & Recommendations:  Problem # 1:  HIV INFECTION (ICD-042) superb cntrol Orders: Est. Patient Level IV (99214)Future Orders: T-CD4SP ([facility]) (CD4SP) ... 07/30/2010 T-HIV Viral Load [phone]) ... 07/30/2010 T-CBC w/Diff ([id number]) ... 07/30/2010 T-Comprehensive Metabolic Panel [id number]) ... 07/30/2010 T-Lipid Profile [id number]) ... 07/30/2010 T-RPR (Syphilis) [phone]) ... 07/30/2010  Diagnostics Reviewed:  HIV: CDC-defined AIDS (05/10/2009)   CD4: 740 (04/19/2010)   WBC: 6.8 (04/18/2010)   Hgb: 14.1 (04/18/2010)   HCT: 42.5 (04/18/2010)    Platelets: 119 (04/18/2010) HIV genotype: REPORT (08/04/2006)   HIV-1 RNA: <20 copies/mL (04/18/2010)   HBSAg: NO (06/02/2006)  Problem # 2:  HYPERTRIGLYCERIDEMIA (ICD-272.1)  adding lipitor low dose rtc in few months for recheck lfts and lipids His updated medication list for this problem includes:    Niaspan 1000 Mg Cr-tabs (Niacin (antihyperlipidemic)) .Marland Kitchen... Take 2 tablet by mouth once a day    Atorvastatin Calcium 10 Mg Tabs (Atorvastatin calcium) .Marland Kitchen... Take 1 tablet by mouth once a day  Orders: Est. Patient Level IV (40347)  Problem # 3:  CIGARETTE SMOKER (ICD-305.1)  counselled to quit  Orders: Est. Patient Level IV (42595)  Medications Added to Medication List This Visit: 1)  Atorvastatin Calcium 10 Mg Tabs (Atorvastatin calcium) .... Take 1 tablet by mouth once a day  Other Orders: Influenza Vaccine NON MCR (63875)  Lipid Assessment/Plan:      Based on NCEP/ATP III, the patient's risk factor category is "2 or more risk factors and a calculated 10 year CAD risk of < 20%".  The patient's lipid goals are as follows: Total cholesterol goal is 200; LDL cholesterol goal is 130; HDL cholesterol goal is 40; Triglyceride goal is 150.  His LDL cholesterol goal has been met.    Patient Instructions: 1)  rtc in 4 months time 2)  Be sure to return for lab work one (1) week before your next appointment as scheduled.  Prescriptions: ATORVASTATIN CALCIUM 10 MG TABS (ATORVASTATIN CALCIUM) Take 1 tablet by mouth once a day  #30 x 11   Entered and Authorized by:   Acey Lav MD   Signed by:   Paulette Blanch Dam MD on 05/02/2010   Method used:   Electronically to        [facility] [address] (retail)       [address]       [place], [place]  [address]       Ph: [phone] or [phone]       Fax: [phone]   RxID:   [id number]    Immunizations Administered:  Influenza Vaccine # 1:    Vaccine Type: Fluvax Non-MCR    Site: right  deltoid    Dose: 0.5 ml    Route: IM    Given by: Starleen Arms CMA    Exp. Date: 06/302012    Lot #: [id number]    VIS given: 10/31/09 version given May 02, 2010.  Flu Vaccine Consent Questions:    Do you have a history of severe allergic reactions to this vaccine? no    Any prior history of allergic reactions to egg and/or gelatin? no    Do you have a sensitivity to the preservative Thimersol? no    Do you have a past history of Guillan-Barre Syndrome? no    Do you currently have an acute febrile illness? no    Have you ever had a severe reaction to latex? no    Vaccine information given and explained to patient? yes

## 2010-06-25 LAB — T-HELPER CELL (CD4) - (RCID CLINIC ONLY)
CD4 % Helper T Cell: 43 % (ref 33–55)
CD4 T Cell Abs: 610 uL (ref 400–2700)

## 2010-06-27 LAB — T-HELPER CELL (CD4) - (RCID CLINIC ONLY)
CD4 % Helper T Cell: 44 % (ref 33–55)
CD4 T Cell Abs: 770 uL (ref 400–2700)

## 2010-07-14 LAB — T-HELPER CELL (CD4) - (RCID CLINIC ONLY)
CD4 % Helper T Cell: 43 % (ref 33–55)
CD4 T Cell Abs: 730 uL (ref 400–2700)

## 2010-07-24 LAB — T-HELPER CELL (CD4) - (RCID CLINIC ONLY)
CD4 % Helper T Cell: 42 % (ref 33–55)
CD4 T Cell Abs: 690 uL (ref 400–2700)

## 2010-07-30 ENCOUNTER — Other Ambulatory Visit: Payer: Self-pay | Admitting: Infectious Disease

## 2010-07-30 DIAGNOSIS — E785 Hyperlipidemia, unspecified: Secondary | ICD-10-CM

## 2010-07-31 ENCOUNTER — Other Ambulatory Visit: Payer: Self-pay | Admitting: *Deleted

## 2010-07-31 DIAGNOSIS — E785 Hyperlipidemia, unspecified: Secondary | ICD-10-CM

## 2010-07-31 MED ORDER — NIACIN ER (ANTIHYPERLIPIDEMIC) 1000 MG PO TBCR: 2000.0000 mg | EXTENDED_RELEASE_TABLET | Freq: Every day | ORAL | Status: DC

## 2010-08-20 ENCOUNTER — Other Ambulatory Visit: Payer: Self-pay | Admitting: Infectious Disease

## 2010-08-20 DIAGNOSIS — K219 Gastro-esophageal reflux disease without esophagitis: Secondary | ICD-10-CM

## 2010-08-21 NOTE — Assessment & Plan Note (Signed)
[facility]                            CARDIOLOGY OFFICE NOTE   Russell Cooper, Russell Cooper                    MRN:          [id number]  DATE:02/23/2008                            DOB:          [birth year]    PRIMARY CARE PHYSICIAN:  Russell Lav, MD   REASON FOR REFERRAL:  Evaluate the patient with chest pain.   HISTORY OF PRESENT ILLNESS:  The patient is a 40 year old gentleman who  was seen in the ER on February 06, 2008, with chest discomfort.  At that  time, he did not have any abnormal cardiac enzymes.  His EKG was  abnormal.  They did want to keep him apparently for observation, but he  signed out against medical advice.  He said at that time it was a left-  sided discomfort, it was at rest.  There was also some discomfort in his  left arm.  It lasted about 5 minutes.  It was 3/10 in intensity.  His  arm felt like, it was asleep for about an hour.  It all resolved  spontaneously.  He said he has had no recurrence of that since then.  He  does not exercise routinely, but he is active in his daily chores.  He  said, he has not been able bring on any chest discomfort, neck, or arm  discomfort.  He has no shortness of breath.  He has no palpitation,  presyncope, or syncope.  He has no PND or orthopnea.   PAST MEDICAL HISTORY:  Gastroesophageal reflux disease, mild  hyperlipidemia, and HIV positive.   PAST SURGICAL HISTORY:  None.   ALLERGIES:  None.   MEDICATIONS:  1. Atripla daily.  2. Niaspan 1000 mg nightly.  3. Omeprazole.   SOCIAL HISTORY:  The patient has a roommate.  He works in [employer].  He has  smoked half pack of cigarettes a day for the last 10 years.   FAMILY HISTORY:  Noncontributory for early coronary artery disease.   REVIEW OF SYSTEMS:  As stated in the HPI and negative for all other  systems.   PHYSICAL EXAMINATION:  GENERAL:  The patient is in no distress.  VITAL SIGNS:  Blood pressure 144/86, heart rate 97 and regular,  and  weight 184 pounds.  HEENT:  Eyelids unremarkable.  Pupils equal, round, and reactive to  light.  Fundi not visualized, oral mucosa unremarkable.  NECK:  No jugular venous distention at 45 degrees.  Carotid upstroke  brisk and symmetrical.  No bruits, no thyromegaly.  LYMPHATICS:  No  cervical, axillary, and inguinal adenopathy.  LUNGS:  Clear to auscultation bilaterally.  BACK:  No costovertebral angle tenderness.  CHEST:  Unremarkable.  HEART:  PMI not displaced or sustained.  S1 and S2 within normals.  No  S3, no S4, no clicks, no rubs, and no murmurs.  ABDOMEN:  Obese,  positive bowel sounds.  Normal in frequency and pitch; no bruits, no  rebound, no guarding, no midline pulsatile mass, no hepatomegaly, and no  splenomegaly.  SKIN:  No rashes, no nodules.  EXTREMITIES:  A 2+ pulses throughout; no  edema, no cyanosis, and no  clubbing.  NEUROLOGIC:  Oriented to person, place, and time.  Cranial nerves II  through XII are grossly intact.  Motor grossly intact.   EKG sinus rhythm, rate 88, axis within normal limits, intervals within  normal limits, and no acute ST-wave changes.   ASSESSMENT AND PLAN:  1. Chest discomfort.  The patient's chest comfort happened one day, he      has had none since then.  He has minimal cardiovascular risk      factors and a normal EKG and physical exam.  At this point, I think      the pretest probability of obstructive coronary artery disease is      very low and I would not suggest further evaluation.  However, I      did take this opportunity to discuss primary risk reduction.  2. Hypertension.  Blood pressure is borderline.  It has not been      elevated before.  We will keep an eye on this.  If he lost few      pounds I suspect he would not need any management of this other      than lifestyle changes.  3. Tobacco.  We discussed at length the need to stop smoking (greater      than 3 minutes).  Hopefully, he will comply with this.  4.  Dyslipidemia per his primary physician.  5. Followup.  He will be in this clinic as needed.     Rollene Rotunda, MD, [facility]  Electronically Signed    JH/MedQ  DD: 02/26/2008  DT: 02/27/2008  Job #: [id number]   cc:   Russell Lav, MD

## 2010-09-04 ENCOUNTER — Other Ambulatory Visit: Payer: Self-pay

## 2010-09-05 ENCOUNTER — Other Ambulatory Visit (INDEPENDENT_AMBULATORY_CARE_PROVIDER_SITE_OTHER): Payer: BC Managed Care – PPO

## 2010-09-05 DIAGNOSIS — Z79899 Other long term (current) drug therapy: Secondary | ICD-10-CM

## 2010-09-05 DIAGNOSIS — Z113 Encounter for screening for infections with a predominantly sexual mode of transmission: Secondary | ICD-10-CM

## 2010-09-05 DIAGNOSIS — B20 Human immunodeficiency virus [HIV] disease: Secondary | ICD-10-CM

## 2010-09-05 LAB — LIPID PANEL
Cholesterol: 186 mg/dL (ref 0–200)
HDL: 38 mg/dL — ABNORMAL LOW (ref >39)
Total CHOL/HDL Ratio: 4.9 Ratio
Triglycerides: 239 mg/dL — ABNORMAL HIGH (ref <150)
VLDL: 48 mg/dL — ABNORMAL HIGH (ref 0–40)

## 2010-09-05 LAB — CBC WITH DIFFERENTIAL/PLATELET
Basophils Absolute: 0 10*3/uL (ref 0.0–0.1)
Basophils Relative: 0 % (ref 0–1)
Eosinophils Absolute: 0 10*3/uL (ref 0.0–0.7)
Eosinophils Relative: 1 % (ref 0–5)
HCT: 45.1 % (ref 39.0–52.0)
Hemoglobin: 15.1 g/dL (ref 13.0–17.0)
MCH: 32.1 pg (ref 26.0–34.0)
MCHC: 33.5 g/dL (ref 30.0–36.0)
Monocytes Absolute: 0.4 10*3/uL (ref 0.1–1.0)
Monocytes Relative: 7 % (ref 3–12)
Neutro Abs: 3.6 10*3/uL (ref 1.7–7.7)
RDW: 13.3 % (ref 11.5–15.5)

## 2010-09-06 LAB — COMPLETE METABOLIC PANEL WITH GFR
AST: 20 U/L (ref 0–37)
Albumin: 4.7 g/dL (ref 3.5–5.2)
Alkaline Phosphatase: 52 U/L (ref 39–117)
BUN: 9 mg/dL (ref 6–23)
Creat: 0.86 mg/dL (ref 0.40–1.50)
GFR, Est Non African American: >60 mL/min (ref >60)
Glucose, Bld: 109 mg/dL — ABNORMAL HIGH (ref 70–99)
Potassium: 4.9 mEq/L (ref 3.5–5.3)
Total Bilirubin: 0.6 mg/dL (ref 0.3–1.2)

## 2010-09-06 LAB — T-HELPER CELL (CD4) - (RCID CLINIC ONLY): CD4 T Cell Abs: 620 uL (ref 400–2700)

## 2010-09-06 LAB — HIV-1 RNA QUANT-NO REFLEX-BLD: HIV-1 RNA Quant, Log: <1.3 {Log} (ref <1.30)

## 2010-09-17 ENCOUNTER — Encounter: Payer: Self-pay | Admitting: Infectious Disease

## 2010-09-17 ENCOUNTER — Ambulatory Visit (INDEPENDENT_AMBULATORY_CARE_PROVIDER_SITE_OTHER): Payer: BC Managed Care – PPO | Admitting: Infectious Disease

## 2010-09-17 VITALS — BP 134/81 | HR 83 | Temp 98.2°F | Ht 69.0 in | Wt 193.0 lb

## 2010-09-17 DIAGNOSIS — E781 Pure hyperglyceridemia: Secondary | ICD-10-CM

## 2010-09-17 DIAGNOSIS — F17200 Nicotine dependence, unspecified, uncomplicated: Secondary | ICD-10-CM

## 2010-09-17 DIAGNOSIS — B20 Human immunodeficiency virus [HIV] disease: Secondary | ICD-10-CM

## 2010-09-17 NOTE — Assessment & Plan Note (Signed)
Continue Atripla 

## 2010-09-17 NOTE — Progress Notes (Signed)
  Subjective:    Patient ID: Russell Cooper, male    DOB: 01/15/1971, 40 y.o.   MRN: [id number]  HPI  40 year old Caucasian male who is apparently well controlled on his Atripla. He is monogamous with his HIV positive partner was also suppressed on Atripla. He endorses condom use with all forms of sexual intercourse. We reviewed all of his laboratory data again today. He to smoke half a pack of cigarettes per day. I counseled him to stop smoking. He believes that he started the low-dose Lipitor at his last visit although did not bring medications with him. We again spent spent quite a bit of time discussing other anti-retroviral regimens that might be more lipid friendly than his current one is. He desires t to remain on Atripla for now. We'll revisit other anti-retroviral regimens were discussed including in the "Quad", isentress or intelence based regimens at his next visit. In all we spent greater than 45 minutes the patient including breath at present time counseling the patient face-to-face and coordination of his care.  Review of Systems    as in history of present illness otherwise 12 point review of systems is negative. Objective:   Physical Exam    patient is alert and oriented x4 HNT normocephalic atraumatic ground Anicteric oropharynx clear neck supple or vessel exam regular rate and rhythm no murmurs rubs heard lungs good auscultation bilaterally without wheeze or rales soft nondistended nontender Gemzar edema neurological nonfocal normal gait normal strength. Psychiatric is normal normal affect normal thought content and linear and goal-directed thought.    Assessment & Plan:  HIV INFECTION Continue Atripla.  CIGARETTE SMOKER Counseled to stop again.  HYPERTRIGLYCERIDEMIA Continue niacin and low-dose Lipitor he'll attempt to lose weight through a low carbohydrate residue diet.

## 2010-09-17 NOTE — Assessment & Plan Note (Signed)
Continue niacin and low-dose Lipitor he'll attempt to lose weight through a low carbohydrate residue diet.

## 2010-09-17 NOTE — Assessment & Plan Note (Signed)
Counseled to stop again.

## 2011-01-01 LAB — T-HELPER CELL (CD4) - (RCID CLINIC ONLY): CD4 % Helper T Cell: 25 — ABNORMAL LOW

## 2011-01-07 LAB — T-HELPER CELL (CD4) - (RCID CLINIC ONLY)
CD4 % Helper T Cell: 45
CD4 T Cell Abs: 650

## 2011-01-08 LAB — CBC
HCT: 38.2 — ABNORMAL LOW
Hemoglobin: 13.7
MCHC: 35.8
MCV: 92.4
RBC: 4.13 — ABNORMAL LOW

## 2011-01-08 LAB — COMPREHENSIVE METABOLIC PANEL
BUN: 10
CO2: 26
Calcium: 9
Creatinine, Ser: 0.9
GFR calc Af Amer: >60
GFR calc non Af Amer: >60
Glucose, Bld: 114 — ABNORMAL HIGH

## 2011-01-08 LAB — POCT CARDIAC MARKERS
CKMB, poc: <1 — ABNORMAL LOW
Myoglobin, poc: 63.3

## 2011-01-08 LAB — DIFFERENTIAL
Basophils Absolute: 0
Lymphocytes Relative: 23
Lymphs Abs: 1.4
Neutro Abs: 3.9
Neutrophils Relative %: 66

## 2011-01-08 LAB — T-HELPER CELL (CD4) - (RCID CLINIC ONLY)
CD4 % Helper T Cell: 43
CD4 T Cell Abs: 560

## 2011-02-06 ENCOUNTER — Encounter (INDEPENDENT_AMBULATORY_CARE_PROVIDER_SITE_OTHER): Payer: BC Managed Care – PPO | Admitting: Infectious Disease

## 2011-02-06 ENCOUNTER — Other Ambulatory Visit: Payer: Self-pay | Admitting: Infectious Disease

## 2011-02-06 DIAGNOSIS — Z23 Encounter for immunization: Secondary | ICD-10-CM

## 2011-02-06 DIAGNOSIS — B20 Human immunodeficiency virus [HIV] disease: Secondary | ICD-10-CM

## 2011-03-06 ENCOUNTER — Other Ambulatory Visit: Payer: BC Managed Care – PPO

## 2011-03-20 ENCOUNTER — Ambulatory Visit: Payer: BC Managed Care – PPO | Admitting: Infectious Disease

## 2011-03-29 ENCOUNTER — Other Ambulatory Visit: Payer: Self-pay | Admitting: *Deleted

## 2011-03-29 DIAGNOSIS — K219 Gastro-esophageal reflux disease without esophagitis: Secondary | ICD-10-CM

## 2011-03-29 MED ORDER — OMEPRAZOLE 20 MG PO CPDR: 20.0000 mg | DELAYED_RELEASE_CAPSULE | Freq: Every day | ORAL | Status: DC

## 2011-04-08 ENCOUNTER — Other Ambulatory Visit: Payer: Self-pay | Admitting: *Deleted

## 2011-04-08 ENCOUNTER — Other Ambulatory Visit: Payer: Self-pay | Admitting: Infectious Diseases

## 2011-04-08 ENCOUNTER — Other Ambulatory Visit: Payer: BC Managed Care – PPO

## 2011-04-08 DIAGNOSIS — Z113 Encounter for screening for infections with a predominantly sexual mode of transmission: Secondary | ICD-10-CM

## 2011-04-08 DIAGNOSIS — B20 Human immunodeficiency virus [HIV] disease: Secondary | ICD-10-CM

## 2011-04-08 LAB — LIPID PANEL
Cholesterol: 182 mg/dL (ref 0–200)
HDL: 42 mg/dL (ref >39)
Total CHOL/HDL Ratio: 4.3 Ratio
Triglycerides: 145 mg/dL (ref <150)
VLDL: 29 mg/dL (ref 0–40)

## 2011-04-08 LAB — URINALYSIS, ROUTINE W REFLEX MICROSCOPIC
Bilirubin Urine: NEGATIVE
Glucose, UA: NEGATIVE mg/dL
Ketones, ur: NEGATIVE mg/dL
Specific Gravity, Urine: 1.02 (ref 1.005–1.030)
Urobilinogen, UA: 1 mg/dL (ref 0.0–1.0)
pH: 7 (ref 5.0–8.0)

## 2011-04-08 LAB — COMPREHENSIVE METABOLIC PANEL
ALT: 25 U/L (ref 0–53)
BUN: 10 mg/dL (ref 6–23)
CO2: 25 mEq/L (ref 19–32)
Calcium: 8.9 mg/dL (ref 8.4–10.5)
Chloride: 103 mEq/L (ref 96–112)
Creat: 0.83 mg/dL (ref 0.50–1.35)
Total Bilirubin: 0.5 mg/dL (ref 0.3–1.2)

## 2011-04-08 LAB — T-HELPER CELL (CD4) - (RCID CLINIC ONLY)
CD4 % Helper T Cell: 42 % (ref 33–55)
CD4 T Cell Abs: 580 uL (ref 400–2700)

## 2011-04-08 LAB — CBC WITH DIFFERENTIAL/PLATELET
Eosinophils Absolute: 0.1 10*3/uL (ref 0.0–0.7)
Eosinophils Relative: 1 % (ref 0–5)
HCT: 44.3 % (ref 39.0–52.0)
Lymphs Abs: 1.6 10*3/uL (ref 0.7–4.0)
MCH: 33.3 pg (ref 26.0–34.0)
MCV: 97.6 fL (ref 78.0–100.0)
Monocytes Absolute: 0.4 10*3/uL (ref 0.1–1.0)
Platelets: 95 10*3/uL — ABNORMAL LOW (ref 150–400)
RDW: 13 % (ref 11.5–15.5)

## 2011-04-09 LAB — GC/CHLAMYDIA PROBE AMP, URINE
Chlamydia, Swab/Urine, PCR: NEGATIVE
GC Probe Amp, Urine: NEGATIVE

## 2011-04-11 LAB — HIV-1 RNA QUANT-NO REFLEX-BLD: HIV 1 RNA Quant: <20 copies/mL (ref <20)

## 2011-04-22 ENCOUNTER — Encounter: Payer: Self-pay | Admitting: Infectious Disease

## 2011-04-22 ENCOUNTER — Ambulatory Visit (INDEPENDENT_AMBULATORY_CARE_PROVIDER_SITE_OTHER): Payer: BC Managed Care – PPO | Admitting: Infectious Disease

## 2011-04-22 VITALS — BP 130/92 | HR 69 | Temp 98.0°F | Ht 70.0 in | Wt 188.0 lb

## 2011-04-22 DIAGNOSIS — I10 Essential (primary) hypertension: Secondary | ICD-10-CM

## 2011-04-22 DIAGNOSIS — E781 Pure hyperglyceridemia: Secondary | ICD-10-CM

## 2011-04-22 DIAGNOSIS — F17200 Nicotine dependence, unspecified, uncomplicated: Secondary | ICD-10-CM

## 2011-04-22 DIAGNOSIS — B20 Human immunodeficiency virus [HIV] disease: Secondary | ICD-10-CM

## 2011-04-22 LAB — MICROALBUMIN / CREATININE URINE RATIO
Creatinine, Urine: 40.5 mg/dL
Microalb, Ur: 0.5 mg/dL (ref 0.00–1.89)

## 2011-04-22 NOTE — Assessment & Plan Note (Signed)
Check microablumin to creatine ratio today monitor home bp

## 2011-04-22 NOTE — Assessment & Plan Note (Signed)
counselled to quit 

## 2011-04-22 NOTE — Progress Notes (Signed)
  Subjective:    Patient ID: Russell Cooper, male    DOB: November 26, 1970, 41 y.o.   MRN: [id number]  HPI  Russell Cooper is a 41 y.o. male who is doing superbly well on their antiviral regimen, with undetectable viral load and health cd4 count. He continues to smoke up to a pack of cigaretters per day. TG are improved. He has not specific complaints. Diastolic bp up a bit into the 40J.  Review of Systems  Constitutional: Negative for fever, chills, diaphoresis, activity change, appetite change, fatigue and unexpected weight change.  HENT: Negative for congestion, sore throat, rhinorrhea, sneezing, trouble swallowing and sinus pressure.   Eyes: Negative for photophobia and visual disturbance.  Respiratory: Negative for cough, chest tightness, shortness of breath, wheezing and stridor.   Cardiovascular: Negative for chest pain, palpitations and leg swelling.  Gastrointestinal: Negative for nausea, vomiting, abdominal pain, diarrhea, constipation, blood in stool, abdominal distention and anal bleeding.  Genitourinary: Negative for dysuria, hematuria, flank pain and difficulty urinating.  Musculoskeletal: Negative for myalgias, back pain, joint swelling, arthralgias and gait problem.  Skin: Negative for color change, pallor, rash and wound.  Neurological: Negative for dizziness, tremors, weakness and light-headedness.  Hematological: Negative for adenopathy. Does not bruise/bleed easily.  Psychiatric/Behavioral: Negative for behavioral problems, confusion, sleep disturbance, dysphoric mood, decreased concentration and agitation.       Objective:   Physical Exam  Constitutional: He is oriented to person, place, and time. He appears well-developed and well-nourished. No distress.  HENT:  Head: Normocephalic and atraumatic.  Mouth/Throat: Oropharynx is clear and moist. No oropharyngeal exudate.  Eyes: Conjunctivae and EOM are normal. Pupils are equal, round, and reactive to light. No scleral  icterus.  Neck: Normal range of motion. Neck supple. No JVD present.  Cardiovascular: Normal rate, regular rhythm and normal heart sounds.  Exam reveals no gallop and no friction rub.   No murmur heard. Pulmonary/Chest: Effort normal and breath sounds normal. No respiratory distress. He has no wheezes. He has no rales. He exhibits no tenderness.  Abdominal: He exhibits no distension and no mass. There is no tenderness. There is no rebound and no guarding.  Musculoskeletal: He exhibits no edema and no tenderness.  Lymphadenopathy:    He has no cervical adenopathy.  Neurological: He is alert and oriented to person, place, and time. He has normal reflexes. He exhibits normal muscle tone. Coordination normal.  Skin: Skin is warm and dry. He is not diaphoretic. No erythema. No pallor.  Psychiatric: He has a normal mood and affect. His behavior is normal. Judgment and thought content normal.          Assessment & Plan:  HIV INFECTION Perfect control  CIGARETTE SMOKER counselled to quit  HYPERTRIGLYCERIDEMIA tg down. May need higher dose of lipitor  HTN (hypertension) Check microablumin to creatine ratio today monitor home bp

## 2011-04-22 NOTE — Assessment & Plan Note (Signed)
tg down. May need higher dose of lipitor

## 2011-04-22 NOTE — Assessment & Plan Note (Signed)
Perfect control 

## 2011-05-02 ENCOUNTER — Other Ambulatory Visit: Payer: Self-pay | Admitting: *Deleted

## 2011-05-02 DIAGNOSIS — E785 Hyperlipidemia, unspecified: Secondary | ICD-10-CM

## 2011-05-02 MED ORDER — ATORVASTATIN CALCIUM 10 MG PO TABS: 10.0000 mg | ORAL_TABLET | Freq: Every day | ORAL | Status: DC

## 2011-05-20 ENCOUNTER — Telehealth: Payer: Self-pay | Admitting: *Deleted

## 2011-05-20 NOTE — Telephone Encounter (Signed)
States he got a "bug " from his co-workers. C/o congestion & bodyaches & cough. No fever. Has been taking ibuprofen. Advised tylenol, rest ,plenty of fluids, mucinex or store brand of this. otc to treat symptoms. Call if fever or getting worse. He did get a flu shot. He agreed with plan & will call if worse

## 2011-07-19 ENCOUNTER — Other Ambulatory Visit: Payer: Self-pay | Admitting: Licensed Clinical Social Worker

## 2011-07-19 DIAGNOSIS — E785 Hyperlipidemia, unspecified: Secondary | ICD-10-CM

## 2011-07-19 MED ORDER — NIACIN ER (ANTIHYPERLIPIDEMIC) 1000 MG PO TBCR: 2000.0000 mg | EXTENDED_RELEASE_TABLET | Freq: Every day | ORAL | Status: DC

## 2011-10-04 ENCOUNTER — Other Ambulatory Visit: Payer: Self-pay | Admitting: *Deleted

## 2011-10-04 DIAGNOSIS — B20 Human immunodeficiency virus [HIV] disease: Secondary | ICD-10-CM

## 2011-10-04 DIAGNOSIS — E785 Hyperlipidemia, unspecified: Secondary | ICD-10-CM

## 2011-10-04 DIAGNOSIS — K219 Gastro-esophageal reflux disease without esophagitis: Secondary | ICD-10-CM

## 2011-10-04 MED ORDER — OMEPRAZOLE 20 MG PO CPDR: 20.0000 mg | DELAYED_RELEASE_CAPSULE | Freq: Every day | ORAL | Status: DC

## 2011-10-04 MED ORDER — EFAVIRENZ-EMTRICITAB-TENOFOVIR 600-200-300 MG PO TABS: 1.0000 | ORAL_TABLET | Freq: Every day | ORAL | Status: DC

## 2011-10-07 ENCOUNTER — Other Ambulatory Visit: Payer: Self-pay | Admitting: Licensed Clinical Social Worker

## 2011-10-07 DIAGNOSIS — K219 Gastro-esophageal reflux disease without esophagitis: Secondary | ICD-10-CM

## 2011-10-07 MED ORDER — OMEPRAZOLE 20 MG PO CPDR: 20.0000 mg | DELAYED_RELEASE_CAPSULE | Freq: Every day | ORAL | Status: DC

## 2011-11-28 ENCOUNTER — Other Ambulatory Visit: Payer: Self-pay | Admitting: Infectious Disease

## 2011-11-28 DIAGNOSIS — Z113 Encounter for screening for infections with a predominantly sexual mode of transmission: Secondary | ICD-10-CM

## 2011-12-04 ENCOUNTER — Other Ambulatory Visit (INDEPENDENT_AMBULATORY_CARE_PROVIDER_SITE_OTHER): Payer: BC Managed Care – PPO

## 2011-12-04 DIAGNOSIS — Z113 Encounter for screening for infections with a predominantly sexual mode of transmission: Secondary | ICD-10-CM

## 2011-12-04 DIAGNOSIS — B20 Human immunodeficiency virus [HIV] disease: Secondary | ICD-10-CM

## 2011-12-04 LAB — COMPLETE METABOLIC PANEL WITH GFR
ALT: 23 U/L (ref 0–53)
CO2: 26 mEq/L (ref 19–32)
Calcium: 9.3 mg/dL (ref 8.4–10.5)
Chloride: 103 mEq/L (ref 96–112)
Creat: 0.94 mg/dL (ref 0.50–1.35)
GFR, Est African American: >89 mL/min
GFR, Est Non African American: >89 mL/min
Glucose, Bld: 106 mg/dL — ABNORMAL HIGH (ref 70–99)
Total Bilirubin: 0.6 mg/dL (ref 0.3–1.2)

## 2011-12-04 LAB — LIPID PANEL
HDL: 40 mg/dL (ref >39)
LDL Cholesterol: 108 mg/dL — ABNORMAL HIGH (ref 0–99)
Triglycerides: 153 mg/dL — ABNORMAL HIGH (ref <150)

## 2011-12-04 LAB — CBC WITH DIFFERENTIAL/PLATELET
Eosinophils Relative: 1 % (ref 0–5)
HCT: 43.2 % (ref 39.0–52.0)
Hemoglobin: 15.1 g/dL (ref 13.0–17.0)
Lymphocytes Relative: 32 % (ref 12–46)
MCHC: 35 g/dL (ref 30.0–36.0)
MCV: 92.9 fL (ref 78.0–100.0)
Monocytes Absolute: 0.4 10*3/uL (ref 0.1–1.0)
Monocytes Relative: 6 % (ref 3–12)
Neutro Abs: 3.6 10*3/uL (ref 1.7–7.7)
WBC: 5.9 10*3/uL (ref 4.0–10.5)

## 2011-12-05 LAB — HIV-1 RNA QUANT-NO REFLEX-BLD: HIV-1 RNA Quant, Log: <1.3 {Log} (ref <1.30)

## 2011-12-05 LAB — T-HELPER CELL (CD4) - (RCID CLINIC ONLY): CD4 % Helper T Cell: 42 % (ref 33–55)

## 2011-12-18 ENCOUNTER — Encounter: Payer: Self-pay | Admitting: Infectious Disease

## 2011-12-18 ENCOUNTER — Ambulatory Visit (INDEPENDENT_AMBULATORY_CARE_PROVIDER_SITE_OTHER): Payer: BC Managed Care – PPO | Admitting: Infectious Disease

## 2011-12-18 VITALS — BP 143/89 | HR 71 | Temp 98.6°F | Wt 189.0 lb

## 2011-12-18 DIAGNOSIS — Z23 Encounter for immunization: Secondary | ICD-10-CM

## 2011-12-18 DIAGNOSIS — F419 Anxiety disorder, unspecified: Secondary | ICD-10-CM | POA: Insufficient documentation

## 2011-12-18 DIAGNOSIS — I10 Essential (primary) hypertension: Secondary | ICD-10-CM

## 2011-12-18 DIAGNOSIS — B20 Human immunodeficiency virus [HIV] disease: Secondary | ICD-10-CM

## 2011-12-18 DIAGNOSIS — F329 Major depressive disorder, single episode, unspecified: Secondary | ICD-10-CM | POA: Insufficient documentation

## 2011-12-18 DIAGNOSIS — F17200 Nicotine dependence, unspecified, uncomplicated: Secondary | ICD-10-CM

## 2011-12-18 DIAGNOSIS — F4321 Adjustment disorder with depressed mood: Secondary | ICD-10-CM | POA: Insufficient documentation

## 2011-12-18 DIAGNOSIS — F411 Generalized anxiety disorder: Secondary | ICD-10-CM

## 2011-12-18 MED ORDER — BUPROPION HCL ER (XL) 150 MG PO TB24: 150.0000 mg | ORAL_TABLET | ORAL | Status: DC

## 2011-12-18 NOTE — Assessment & Plan Note (Signed)
Will start wellbutrin XL

## 2011-12-18 NOTE — Progress Notes (Signed)
Subjective:    Patient ID: Russell Cooper, male    DOB: 19-Jun-1970, 42 y.o.   MRN: [id number]  HPI  Russell Cooper is a 41 y.o. male who is doing superbly well on his  antiviral regimen, with undetectable viral load and health cd4 count on atripla. He is under considerable amount of stress related to a)moving to new home on [address] near [place] which he and his partner dislike, b) loss of his aunt with whom he is very close to and c) dog who is ailing and will likely need to be put down. He also at one point this states that he "broke down and was very unhappy and was given Wellbutrin for a few days which did help his depressive symptoms. We discussed various antidepressants and opted to head with [name]. Also offered to give him benzodiazepine for breakthrough anxiety but he preferred to try the Wellbutrin for now. This could also help with his smoking cessation.  I spent greater than 45 minutes with the patient including greater than 50% of time in face to face counsel of the patient and in coordination of their care.    Review of Systems  Constitutional: Negative for fever, chills, diaphoresis, activity change, appetite change, fatigue and unexpected weight change.  HENT: Negative for congestion, sore throat, rhinorrhea, sneezing, trouble swallowing and sinus pressure.   Eyes: Negative for photophobia and visual disturbance.  Respiratory: Negative for cough, chest tightness, shortness of breath, wheezing and stridor.   Cardiovascular: Negative for chest pain, palpitations and leg swelling.  Gastrointestinal: Negative for nausea, vomiting, abdominal pain, diarrhea, constipation, blood in stool, abdominal distention and anal bleeding.  Genitourinary: Negative for dysuria, hematuria, flank pain and difficulty urinating.  Musculoskeletal: Negative for myalgias, back pain, joint swelling, arthralgias and gait problem.  Skin: Negative for color change, pallor, rash and wound.    Neurological: Negative for dizziness, tremors, weakness and light-headedness.  Hematological: Negative for adenopathy. Does not bruise/bleed easily.  Psychiatric/Behavioral: Positive for dysphoric mood. Negative for suicidal ideas, behavioral problems, confusion, disturbed wake/sleep cycle, self-injury, decreased concentration and agitation. The patient is nervous/anxious.        Objective:   Physical Exam  Constitutional: He is oriented to person, place, and time. He appears well-developed and well-nourished. No distress.  HENT:  Head: Normocephalic and atraumatic.  Mouth/Throat: Oropharynx is clear and moist. No oropharyngeal exudate.  Eyes: Conjunctivae normal and EOM are normal. Pupils are equal, round, and reactive to light. No scleral icterus.  Neck: Normal range of motion. Neck supple. No JVD present.  Cardiovascular: Normal rate, regular rhythm and normal heart sounds.  Exam reveals no gallop and no friction rub.   No murmur heard. Pulmonary/Chest: Effort normal and breath sounds normal. No respiratory distress. He has no wheezes. He has no rales. He exhibits no tenderness.  Abdominal: He exhibits no distension and no mass. There is no tenderness. There is no rebound and no guarding.  Musculoskeletal: He exhibits no edema and no tenderness.  Lymphadenopathy:    He has no cervical adenopathy.  Neurological: He is alert and oriented to person, place, and time. He has normal reflexes. He exhibits normal muscle tone. Coordination normal.  Skin: Skin is warm and dry. He is not diaphoretic. No erythema. No pallor.  Psychiatric: His behavior is normal. Judgment and thought content normal. His mood appears anxious. Thought content is not paranoid and not delusional. He exhibits a depressed mood. He expresses no homicidal and no suicidal ideation. He expresses  no suicidal plans and no homicidal plans.          Assessment & Plan:  HIV INFECTION Perfect control!  Depression,  major Will start wellbutrin XL  CIGARETTE SMOKER Hopefully well butyrin XL will help with smoking cessation as well.  Anxiety Consider benzodiazepines if depression not sufficiently controlled with Wellbutrin  HTN (hypertension) Some of this is related to his anxiety and depression but we may still need at another agent will check a microalbumin to creatinine ratio.

## 2011-12-18 NOTE — Assessment & Plan Note (Signed)
Perfect control 

## 2011-12-18 NOTE — Assessment & Plan Note (Signed)
Some of this is related to his anxiety and depression but we may still need at another agent will check a microalbumin to creatinine ratio.

## 2011-12-18 NOTE — Assessment & Plan Note (Signed)
Consider benzodiazepines if depression not sufficiently controlled with Wellbutrin

## 2011-12-18 NOTE — Assessment & Plan Note (Signed)
Hopefully well butyrin XL will help with smoking cessation as well.

## 2012-01-14 ENCOUNTER — Other Ambulatory Visit: Payer: Self-pay | Admitting: Infectious Disease

## 2012-02-27 ENCOUNTER — Other Ambulatory Visit: Payer: Self-pay | Admitting: Licensed Clinical Social Worker

## 2012-02-27 DIAGNOSIS — E785 Hyperlipidemia, unspecified: Secondary | ICD-10-CM

## 2012-02-27 MED ORDER — NIACIN ER (ANTIHYPERLIPIDEMIC) 1000 MG PO TBCR: 2000.0000 mg | EXTENDED_RELEASE_TABLET | Freq: Every day | ORAL | Status: AC

## 2012-04-13 ENCOUNTER — Other Ambulatory Visit: Payer: BC Managed Care – PPO

## 2012-04-20 ENCOUNTER — Ambulatory Visit: Payer: BC Managed Care – PPO | Admitting: Infectious Disease

## 2012-04-27 ENCOUNTER — Ambulatory Visit: Payer: BC Managed Care – PPO | Admitting: Infectious Disease

## 2012-04-28 ENCOUNTER — Ambulatory Visit: Payer: BC Managed Care – PPO | Admitting: Infectious Disease

## 2012-05-25 ENCOUNTER — Other Ambulatory Visit: Payer: Self-pay | Admitting: *Deleted

## 2012-05-25 DIAGNOSIS — E785 Hyperlipidemia, unspecified: Secondary | ICD-10-CM

## 2012-05-25 MED ORDER — ATORVASTATIN CALCIUM 10 MG PO TABS: 10.0000 mg | ORAL_TABLET | Freq: Every day | ORAL | Status: DC

## 2012-06-02 ENCOUNTER — Other Ambulatory Visit: Payer: BC Managed Care – PPO

## 2012-06-03 ENCOUNTER — Other Ambulatory Visit: Payer: Self-pay | Admitting: Infectious Disease

## 2012-06-03 ENCOUNTER — Other Ambulatory Visit: Payer: BC Managed Care – PPO

## 2012-06-03 DIAGNOSIS — B20 Human immunodeficiency virus [HIV] disease: Secondary | ICD-10-CM

## 2012-06-03 LAB — COMPLETE METABOLIC PANEL WITH GFR
ALT: 20 U/L (ref 0–53)
Albumin: 4.8 g/dL (ref 3.5–5.2)
CO2: 26 mEq/L (ref 19–32)
Calcium: 9.5 mg/dL (ref 8.4–10.5)
Chloride: 102 mEq/L (ref 96–112)
GFR, Est African American: >89 mL/min
GFR, Est Non African American: >89 mL/min
Glucose, Bld: 101 mg/dL — ABNORMAL HIGH (ref 70–99)
Sodium: 137 mEq/L (ref 135–145)
Total Protein: 7.4 g/dL (ref 6.0–8.3)

## 2012-06-03 LAB — CBC WITH DIFFERENTIAL/PLATELET
Basophils Absolute: 0 10*3/uL (ref 0.0–0.1)
Basophils Relative: 0 % (ref 0–1)
Eosinophils Absolute: 0.1 10*3/uL (ref 0.0–0.7)
MCH: 32.4 pg (ref 26.0–34.0)
MCHC: 35.1 g/dL (ref 30.0–36.0)
Monocytes Relative: 6 % (ref 3–12)
Neutro Abs: 4.8 10*3/uL (ref 1.7–7.7)
Neutrophils Relative %: 67 % (ref 43–77)
Platelets: 114 10*3/uL — ABNORMAL LOW (ref 150–400)
RDW: 13.4 % (ref 11.5–15.5)

## 2012-06-03 LAB — LIPID PANEL: Total CHOL/HDL Ratio: 3.9 Ratio

## 2012-06-03 LAB — RPR

## 2012-06-04 LAB — HIV-1 RNA QUANT-NO REFLEX-BLD: HIV-1 RNA Quant, Log: 1.32 {Log} — ABNORMAL HIGH (ref <1.30)

## 2012-06-04 LAB — T-HELPER CELL (CD4) - (RCID CLINIC ONLY): CD4 T Cell Abs: 910 uL (ref 400–2700)

## 2012-06-18 ENCOUNTER — Encounter: Payer: Self-pay | Admitting: Infectious Disease

## 2012-06-18 ENCOUNTER — Ambulatory Visit (INDEPENDENT_AMBULATORY_CARE_PROVIDER_SITE_OTHER): Payer: BC Managed Care – PPO | Admitting: Infectious Disease

## 2012-06-18 VITALS — BP 130/84 | HR 76 | Temp 98.9°F | Wt 189.0 lb

## 2012-06-18 DIAGNOSIS — I10 Essential (primary) hypertension: Secondary | ICD-10-CM

## 2012-06-18 DIAGNOSIS — B20 Human immunodeficiency virus [HIV] disease: Secondary | ICD-10-CM

## 2012-06-18 MED ORDER — ATORVASTATIN CALCIUM 20 MG PO TABS: 20.0000 mg | ORAL_TABLET | Freq: Every day | ORAL | Status: AC

## 2012-06-18 NOTE — Progress Notes (Signed)
Subjective:    Patient ID: Russell Cooper, male    DOB: 1970/10/13, 42 y.o.   MRN: [id number]  HPI   Russell Cooper is a 42 y.o. male who is doing superbly well on his  antiviral regimen, with undetectable viral load and health cd4 count on atripla. He had been under considerable amount of stress related to a)moving to new home on [facility] [address] [facility] near [place] which he and his partner dislike, b) loss of his aunt with whom he is very close to and c) dog who had to be put down.   We rx  Wellbutrin but ultimately he began to feel better once he moved and recovered from grief. He is still smoking tobacco. He has positive HIV partner who is also a pt in this clinic and well controlled per the pt.    I spent greater than 45 minutes with the patient including greater than 50% of time in face to face counsel of the patient and in coordination of their care.    Review of Systems  Constitutional: Negative for fever, chills, diaphoresis, activity change, appetite change, fatigue and unexpected weight change.  HENT: Negative for congestion, sore throat, rhinorrhea, sneezing, trouble swallowing and sinus pressure.   Eyes: Negative for photophobia and visual disturbance.  Respiratory: Negative for cough, chest tightness, shortness of breath, wheezing and stridor.   Cardiovascular: Negative for chest pain, palpitations and leg swelling.  Gastrointestinal: Negative for nausea, vomiting, abdominal pain, diarrhea, constipation, blood in stool, abdominal distention and anal bleeding.  Genitourinary: Negative for dysuria, hematuria, flank pain and difficulty urinating.  Musculoskeletal: Negative for myalgias, back pain, joint swelling, arthralgias and gait problem.  Skin: Negative for color change, pallor, rash and wound.  Neurological: Negative for dizziness, tremors, weakness and light-headedness.  Hematological: Negative for adenopathy. Does not bruise/bleed easily.  Psychiatric/Behavioral: Negative for  suicidal ideas, behavioral problems, confusion, sleep disturbance, self-injury, dysphoric mood, decreased concentration and agitation. The patient is not nervous/anxious.        Objective:   Physical Exam  Constitutional: He is oriented to person, place, and time. He appears well-developed and well-nourished. No distress.  HENT:  Head: Normocephalic and atraumatic.  Mouth/Throat: Oropharynx is clear and moist. No oropharyngeal exudate.  Eyes: Conjunctivae and EOM are normal. Pupils are equal, round, and reactive to light. No scleral icterus.  Neck: Normal range of motion. Neck supple. No JVD present.  Cardiovascular: Normal rate, regular rhythm and normal heart sounds.  Exam reveals no gallop and no friction rub.   No murmur heard. Pulmonary/Chest: Effort normal and breath sounds normal. No respiratory distress. He has no wheezes. He has no rales. He exhibits no tenderness.  Abdominal: He exhibits no distension and no mass. There is no tenderness. There is no rebound and no guarding.  Musculoskeletal: He exhibits no edema and no tenderness.  Lymphadenopathy:    He has no cervical adenopathy.  Neurological: He is alert and oriented to person, place, and time. He has normal reflexes. He exhibits normal muscle tone. Coordination normal.  Skin: Skin is warm and dry. He is not diaphoretic. No erythema. No pallor.  Psychiatric: His behavior is normal. Judgment and thought content normal. His mood appears anxious. Thought content is not paranoid and not delusional. He exhibits a depressed mood. He expresses no homicidal and no suicidal ideation. He expresses no suicidal plans and no homicidal plans.          Assessment & Plan:  HIV: continue atripla  Grieving: resolved  Depression: does not feel that he is currently depressed  Smoking: continue to work on trying him to stop  HTN: borderline, partly white coat. Check microalbumin and creatinine

## 2012-06-19 LAB — MICROALBUMIN / CREATININE URINE RATIO
Creatinine, Urine: 50.9 mg/dL
Microalb Creat Ratio: 9.8 mg/g (ref 0.0–30.0)

## 2012-09-27 ENCOUNTER — Encounter (HOSPITAL_BASED_OUTPATIENT_CLINIC_OR_DEPARTMENT_OTHER): Payer: Self-pay | Admitting: *Deleted

## 2012-09-27 ENCOUNTER — Emergency Department (HOSPITAL_BASED_OUTPATIENT_CLINIC_OR_DEPARTMENT_OTHER): Payer: BC Managed Care – PPO

## 2012-09-27 ENCOUNTER — Inpatient Hospital Stay (HOSPITAL_BASED_OUTPATIENT_CLINIC_OR_DEPARTMENT_OTHER)
Admission: EM | Admit: 2012-09-27 | Discharge: 2012-10-07 | DRG: 883 | Disposition: A | Discharge status: Home / Self Care | Payer: BC Managed Care – PPO | Attending: Internal Medicine | Admitting: Internal Medicine

## 2012-09-27 DIAGNOSIS — E7800 Pure hypercholesterolemia, unspecified: Secondary | ICD-10-CM | POA: Diagnosis present

## 2012-09-27 DIAGNOSIS — K3533 Acute appendicitis with perforation and localized peritonitis, with abscess: Secondary | ICD-10-CM

## 2012-09-27 DIAGNOSIS — R339 Retention of urine, unspecified: Secondary | ICD-10-CM | POA: Diagnosis not present

## 2012-09-27 DIAGNOSIS — R011 Cardiac murmur, unspecified: Secondary | ICD-10-CM

## 2012-09-27 DIAGNOSIS — E785 Hyperlipidemia, unspecified: Secondary | ICD-10-CM | POA: Diagnosis present

## 2012-09-27 DIAGNOSIS — F17200 Nicotine dependence, unspecified, uncomplicated: Secondary | ICD-10-CM | POA: Diagnosis present

## 2012-09-27 DIAGNOSIS — K210 Gastro-esophageal reflux disease with esophagitis: Secondary | ICD-10-CM

## 2012-09-27 DIAGNOSIS — R1032 Left lower quadrant pain: Secondary | ICD-10-CM

## 2012-09-27 DIAGNOSIS — F329 Major depressive disorder, single episode, unspecified: Secondary | ICD-10-CM

## 2012-09-27 DIAGNOSIS — Z21 Asymptomatic human immunodeficiency virus [HIV] infection status: Secondary | ICD-10-CM | POA: Diagnosis present

## 2012-09-27 DIAGNOSIS — K560 Paralytic ileus: Secondary | ICD-10-CM | POA: Diagnosis not present

## 2012-09-27 DIAGNOSIS — E876 Hypokalemia: Secondary | ICD-10-CM | POA: Diagnosis not present

## 2012-09-27 DIAGNOSIS — R739 Hyperglycemia, unspecified: Secondary | ICD-10-CM | POA: Diagnosis present

## 2012-09-27 DIAGNOSIS — F419 Anxiety disorder, unspecified: Secondary | ICD-10-CM

## 2012-09-27 DIAGNOSIS — R7309 Other abnormal glucose: Secondary | ICD-10-CM | POA: Diagnosis not present

## 2012-09-27 DIAGNOSIS — R112 Nausea with vomiting, unspecified: Secondary | ICD-10-CM | POA: Diagnosis present

## 2012-09-27 DIAGNOSIS — I10 Essential (primary) hypertension: Secondary | ICD-10-CM

## 2012-09-27 DIAGNOSIS — K3580 Unspecified acute appendicitis: Secondary | ICD-10-CM

## 2012-09-27 DIAGNOSIS — B20 Human immunodeficiency virus [HIV] disease: Secondary | ICD-10-CM

## 2012-09-27 DIAGNOSIS — E781 Pure hyperglyceridemia: Secondary | ICD-10-CM

## 2012-09-27 DIAGNOSIS — K35209 Acute appendicitis with generalized peritonitis, without abscess, unspecified as to perforation: Principal | ICD-10-CM | POA: Diagnosis present

## 2012-09-27 DIAGNOSIS — R509 Fever, unspecified: Secondary | ICD-10-CM | POA: Insufficient documentation

## 2012-09-27 DIAGNOSIS — R1031 Right lower quadrant pain: Secondary | ICD-10-CM | POA: Insufficient documentation

## 2012-09-27 DIAGNOSIS — R197 Diarrhea, unspecified: Secondary | ICD-10-CM | POA: Insufficient documentation

## 2012-09-27 DIAGNOSIS — R109 Unspecified abdominal pain: Secondary | ICD-10-CM

## 2012-09-27 DIAGNOSIS — K352 Acute appendicitis with generalized peritonitis, without abscess: Principal | ICD-10-CM | POA: Diagnosis present

## 2012-09-27 DIAGNOSIS — D696 Thrombocytopenia, unspecified: Secondary | ICD-10-CM | POA: Diagnosis present

## 2012-09-27 DIAGNOSIS — K219 Gastro-esophageal reflux disease without esophagitis: Secondary | ICD-10-CM | POA: Diagnosis present

## 2012-09-27 HISTORY — DX: Cardiac murmur, unspecified: R01.1

## 2012-09-27 HISTORY — DX: Asymptomatic human immunodeficiency virus (hiv) infection status: Z21

## 2012-09-27 HISTORY — DX: Human immunodeficiency virus (HIV) disease: B20

## 2012-09-27 HISTORY — DX: Pure hypercholesterolemia, unspecified: E78.00

## 2012-09-27 LAB — CBC WITH DIFFERENTIAL/PLATELET
Basophils Relative: 0 % (ref 0–1)
Eosinophils Absolute: 0 10*3/uL (ref 0.0–0.7)
HCT: 42.3 % (ref 39.0–52.0)
Hemoglobin: 15.1 g/dL (ref 13.0–17.0)
Lymphs Abs: 0.8 10*3/uL (ref 0.7–4.0)
MCH: 33.8 pg (ref 26.0–34.0)
MCHC: 35.7 g/dL (ref 30.0–36.0)
MCV: 94.6 fL (ref 78.0–100.0)
Monocytes Absolute: 0.7 10*3/uL (ref 0.1–1.0)
Monocytes Relative: 5 % (ref 3–12)
RBC: 4.47 MIL/uL (ref 4.22–5.81)

## 2012-09-27 LAB — URINALYSIS, ROUTINE W REFLEX MICROSCOPIC
Nitrite: NEGATIVE
Specific Gravity, Urine: 1.024 (ref 1.005–1.030)
Urobilinogen, UA: 1 mg/dL (ref 0.0–1.0)

## 2012-09-27 LAB — COMPREHENSIVE METABOLIC PANEL
ALT: 22 U/L (ref 0–53)
AST: 21 U/L (ref 0–37)
Albumin: 4.5 g/dL (ref 3.5–5.2)
Alkaline Phosphatase: 61 U/L (ref 39–117)
Glucose, Bld: 123 mg/dL — ABNORMAL HIGH (ref 70–99)
Potassium: 3.3 mEq/L — ABNORMAL LOW (ref 3.5–5.1)
Sodium: 138 mEq/L (ref 135–145)
Total Protein: 7.8 g/dL (ref 6.0–8.3)

## 2012-09-27 MED ORDER — LACTATED RINGERS IV BOLUS (SEPSIS)
2000.0000 mL | Freq: Once | INTRAVENOUS | Status: AC
  Administered 2012-09-27: 1000 mL via INTRAVENOUS

## 2012-09-27 MED ORDER — ONDANSETRON HCL 4 MG/2ML IJ SOLN
4.0000 mg | Freq: Once | INTRAMUSCULAR | Status: AC
  Administered 2012-09-27: 4 mg via INTRAVENOUS
  Filled 2012-09-27: qty 2

## 2012-09-27 MED ORDER — PROMETHAZINE HCL 25 MG/ML IJ SOLN
12.5000 mg | Freq: Four times a day (QID) | INTRAMUSCULAR | Status: DC | PRN
  Filled 2012-09-27: qty 1

## 2012-09-27 MED ORDER — ONDANSETRON HCL 4 MG/2ML IJ SOLN: 4.0000 mg | Freq: Four times a day (QID) | INTRAMUSCULAR | Status: DC | PRN

## 2012-09-27 MED ORDER — MAGIC MOUTHWASH
15.0000 mL | Freq: Four times a day (QID) | ORAL | Status: DC | PRN
  Filled 2012-09-27: qty 15

## 2012-09-27 MED ORDER — EFAVIRENZ-EMTRICITAB-TENOFOVIR 600-200-300 MG PO TABS
1.0000 | ORAL_TABLET | Freq: Every day | ORAL | Status: DC
  Administered 2012-09-27 – 2012-10-06 (×10): 1 via ORAL
  Filled 2012-09-27 (×11): qty 1

## 2012-09-27 MED ORDER — ONDANSETRON 8 MG/NS 50 ML IVPB
8.0000 mg | Freq: Four times a day (QID) | INTRAVENOUS | Status: DC | PRN
  Filled 2012-09-27: qty 8

## 2012-09-27 MED ORDER — DEXTROSE-NACL 5-0.9 % IV SOLN
INTRAVENOUS | Status: DC
  Administered 2012-09-28: 125 mL via INTRAVENOUS
  Administered 2012-09-28 – 2012-09-29 (×2): via INTRAVENOUS
  Administered 2012-09-29: 125 mL/h via INTRAVENOUS
  Administered 2012-09-30 (×2): via INTRAVENOUS

## 2012-09-27 MED ORDER — IOHEXOL 300 MG/ML  SOLN
100.0000 mL | Freq: Once | INTRAMUSCULAR | Status: AC | PRN
  Administered 2012-09-27: 100 mL via INTRAVENOUS

## 2012-09-27 MED ORDER — ACETAMINOPHEN 500 MG PO TABS
1000.0000 mg | ORAL_TABLET | Freq: Once | ORAL | Status: AC
  Administered 2012-09-27: 1000 mg via ORAL
  Filled 2012-09-27: qty 2

## 2012-09-27 MED ORDER — NIACIN ER (ANTIHYPERLIPIDEMIC) 500 MG PO TBCR
2000.0000 mg | EXTENDED_RELEASE_TABLET | Freq: Every day | ORAL | Status: DC
  Administered 2012-09-27 – 2012-10-06 (×10): 2000 mg via ORAL
  Filled 2012-09-27 (×11): qty 4

## 2012-09-27 MED ORDER — ALUM & MAG HYDROXIDE-SIMETH 200-200-20 MG/5ML PO SUSP: 30.0000 mL | Freq: Four times a day (QID) | ORAL | Status: DC | PRN

## 2012-09-27 MED ORDER — SODIUM CHLORIDE 0.9 % IV SOLN
INTRAVENOUS | Status: DC
  Administered 2012-09-27: 17:00:00 via INTRAVENOUS

## 2012-09-27 MED ORDER — LIP MEDEX EX OINT
1.0000 "application " | TOPICAL_OINTMENT | Freq: Two times a day (BID) | CUTANEOUS | Status: DC
  Administered 2012-09-27 – 2012-10-07 (×19): 1 via TOPICAL
  Filled 2012-09-27 (×3): qty 7

## 2012-09-27 MED ORDER — SODIUM CHLORIDE 0.9 % IV SOLN: INTRAVENOUS | Status: DC

## 2012-09-27 MED ORDER — SACCHAROMYCES BOULARDII 250 MG PO CAPS
250.0000 mg | ORAL_CAPSULE | Freq: Two times a day (BID) | ORAL | Status: DC
  Administered 2012-09-27 – 2012-10-07 (×18): 250 mg via ORAL
  Filled 2012-09-27 (×21): qty 1

## 2012-09-27 MED ORDER — IOHEXOL 300 MG/ML  SOLN
25.0000 mL | Freq: Once | INTRAMUSCULAR | Status: AC | PRN
  Administered 2012-09-27: 25 mL via ORAL

## 2012-09-27 MED ORDER — PANTOPRAZOLE SODIUM 40 MG PO TBEC
80.0000 mg | DELAYED_RELEASE_TABLET | Freq: Every day | ORAL | Status: DC
  Filled 2012-09-27: qty 2

## 2012-09-27 MED ORDER — ATORVASTATIN CALCIUM 20 MG PO TABS
20.0000 mg | ORAL_TABLET | Freq: Every day | ORAL | Status: DC
  Administered 2012-09-27 – 2012-10-07 (×9): 20 mg via ORAL
  Filled 2012-09-27 (×11): qty 1

## 2012-09-27 MED ORDER — HYDROMORPHONE HCL PF 1 MG/ML IJ SOLN
1.0000 mg | Freq: Once | INTRAMUSCULAR | Status: AC
  Administered 2012-09-27: 1 mg via INTRAVENOUS
  Filled 2012-09-27: qty 1

## 2012-09-27 MED ORDER — ACETAMINOPHEN 650 MG RE SUPP: 650.0000 mg | Freq: Four times a day (QID) | RECTAL | Status: DC | PRN

## 2012-09-27 MED ORDER — DIPHENHYDRAMINE HCL 50 MG/ML IJ SOLN: 12.5000 mg | Freq: Four times a day (QID) | INTRAMUSCULAR | Status: DC | PRN

## 2012-09-27 MED ORDER — ACETAMINOPHEN 325 MG PO TABS
325.0000 mg | ORAL_TABLET | Freq: Four times a day (QID) | ORAL | Status: DC | PRN
  Administered 2012-09-28 – 2012-09-30 (×4): 650 mg via ORAL
  Filled 2012-09-27 (×4): qty 2

## 2012-09-27 NOTE — ED Notes (Signed)
ZOX:[id number]<AV> Expected date:<BR> Expected time:<BR> Means of arrival:<BR> Comments:<BR> Tx [facility]

## 2012-09-27 NOTE — ED Notes (Signed)
Patient reports unable to void at this time.

## 2012-09-27 NOTE — ED Notes (Signed)
Report given to Victorino Dike, Charge RN [facility] Emergency Department. Room assignment changed to Room 22.

## 2012-09-27 NOTE — ED Provider Notes (Addendum)
History    Scribed for Carleene Cooper III, MD, the patient was seen in room MH06/MH06. This chart was scribed by Lewanda Rife, ED scribe. Patient's care was started at 1652   CSN: [id number]  Arrival date & time 09/27/12  1600   First MD Initiated Contact with Patient 09/27/12 1645      Chief Complaint  Patient presents with  . Abdominal Pain    (Consider location/radiation/quality/duration/timing/severity/associated sxs/prior treatment) The history is provided by the patient.   HPI Comments: Russell Cooper is a 42 y.o. male who presents to the Emergency Department complaining of constant, moderate diffuse abdominal pain onset acute at noon today. Describes abdominal pain as cramping. Reports associated nausea, emesis, and mild diarrhea. Denies alleviating or aggravating factors. Denies associated dysuria, hematuria, chest pain, shortness of breath, recent abdominal injury, and hx of similar pain. Denies hx of abdominal surgery. Reports hx of HIV. Reports hx of surgery in breast area.    Past Medical History  Diagnosis Date  . HIV (human immunodeficiency virus infection)   . Hypercholesteremia     History reviewed. No pertinent past surgical history.  History reviewed. No pertinent family history.  History  Substance Use Topics  . Smoking status: Current Every Day Smoker -- 10.00 packs/day    Types: Cigarettes  . Smokeless tobacco: Never Used  . Alcohol Use: Yes      Review of Systems  Constitutional: Negative for fever.  Respiratory: Negative for shortness of breath.   Cardiovascular: Negative for chest pain.  Gastrointestinal: Positive for nausea, vomiting, abdominal pain and diarrhea.  Genitourinary: Negative for dysuria, hematuria and difficulty urinating.  Skin: Negative for wound.  Neurological: Negative for headaches.  Psychiatric/Behavioral: Negative for confusion.  All other systems reviewed and are negative.  A complete 10 system review of systems  was obtained and all systems are negative except as noted in the HPI and PMH.    Allergies  Review of patient's allergies indicates no known allergies.  Home Medications   Current Outpatient Rx  Name  Route  Sig  Dispense  Refill  . aspirin 81 MG tablet   Oral   Take 81 mg by mouth daily.          Marland Kitchen atorvastatin (LIPITOR) 20 MG tablet   Oral   Take 1 tablet (20 mg total) by mouth daily.   90 tablet   4   . ATRIPLA 600-200-300 MG per tablet      TAKE 1 TABLET BY MOUTH AT BEDTIME   30 tablet   9   . niacin (NIASPAN) 1000 MG CR tablet   Oral   Take 2 tablets (2,000 mg total) by mouth at bedtime.   60 tablet   11   . Omega-3 Fatty Acids (FISH OIL) 1200 MG CAPS   Oral   Take 3,600 mg by mouth daily.           Marland Kitchen omeprazole (PRILOSEC) 20 MG capsule   Oral   Take 1 capsule (20 mg total) by mouth daily.   90 capsule   4     BP 120/86  Pulse 80  Temp(Src) 98.1 F (36.7 C) (Oral)  Resp 20  Ht 5\' 9"  (1.753 m)  Wt 190 lb (86.183 kg)  BMI 28.05 kg/m2  SpO2 96%  Physical Exam  Nursing note and vitals reviewed. Constitutional: He is oriented to person, place, and time. He appears well-developed and well-nourished. No distress.  HENT:  Head: Normocephalic and atraumatic.  Eyes:  EOM are normal.  Neck: Normal range of motion. Neck supple. No tracheal deviation present.  Cardiovascular: Normal rate, regular rhythm and normal heart sounds.   Pulmonary/Chest: Effort normal and breath sounds normal. No respiratory distress. He has no wheezes. He has no rales.  Abdominal: Soft. Bowel sounds are normal. He exhibits no distension and no mass. There is generalized tenderness (mild and diffuse ). There is no rebound.  Negative obturator sign   Musculoskeletal: Normal range of motion. He exhibits no tenderness.  Lymphadenopathy:    He has no cervical adenopathy.  Neurological: He is alert and oriented to person, place, and time.  Neurologically intact    Skin: Skin is  warm and dry.  Psychiatric: He has a normal mood and affect. His behavior is normal.    ED Course  Procedures (including critical care time) Medications  0.9 %  sodium chloride infusion (not administered)  HYDROmorphone (DILAUDID) injection 1 mg (not administered)  ondansetron (ZOFRAN) injection 4 mg (not administered)   5:02 PM Pt agrees with plan of CT scan and informed of time block expectations for labs, and imaging   Labs Reviewed  URINALYSIS, ROUTINE W REFLEX MICROSCOPIC  COMPREHENSIVE METABOLIC PANEL  LIPASE, BLOOD  CBC WITH DIFFERENTIAL   6:18 PM Pt's pain is better post Iv Dilaudid and Zofran.  He has not given a urine sample, encouraged to do so.  His WBC is high at 14,700.  He is finishing drinking his oral contrast.  7:18 PM Results for orders placed during the hospital encounter of 09/27/12  URINALYSIS, ROUTINE W REFLEX MICROSCOPIC      Result Value Range   Color, Urine AMBER (*) YELLOW   APPearance CLEAR  CLEAR   Specific Gravity, Urine 1.024  1.005 - 1.030   pH 8.0  5.0 - 8.0   Glucose, UA NEGATIVE  NEGATIVE mg/dL   Hgb urine dipstick NEGATIVE  NEGATIVE   Bilirubin Urine SMALL (*) NEGATIVE   Ketones, ur 40 (*) NEGATIVE mg/dL   Protein, ur NEGATIVE  NEGATIVE mg/dL   Urobilinogen, UA 1.0  0.0 - 1.0 mg/dL   Nitrite NEGATIVE  NEGATIVE   Leukocytes, UA NEGATIVE  NEGATIVE  COMPREHENSIVE METABOLIC PANEL      Result Value Range   Sodium 138  135 - 145 mEq/L   Potassium 3.3 (*) 3.5 - 5.1 mEq/L   Chloride 99  96 - 112 mEq/L   CO2 25  19 - 32 mEq/L   Glucose, Bld 123 (*) 70 - 99 mg/dL   BUN 8  6 - 23 mg/dL   Creatinine, Ser 4.09  0.50 - 1.35 mg/dL   Calcium 9.3  8.4 - 81.1 mg/dL   Total Protein 7.8  6.0 - 8.3 g/dL   Albumin 4.5  3.5 - 5.2 g/dL   AST 21  0 - 37 U/L   ALT 22  0 - 53 U/L   Alkaline Phosphatase 61  39 - 117 U/L   Total Bilirubin 0.8  0.3 - 1.2 mg/dL   GFR calc non Af Amer >90  >90 mL/min   GFR calc Af Amer >90  >90 mL/min  LIPASE, BLOOD       Result Value Range   Lipase 15  11 - 59 U/L  CBC WITH DIFFERENTIAL      Result Value Range   WBC 14.7 (*) 4.0 - 10.5 K/uL   RBC 4.47  4.22 - 5.81 MIL/uL   Hemoglobin 15.1  13.0 - 17.0 g/dL   HCT  42.3  39.0 - 52.0 %   MCV 94.6  78.0 - 100.0 fL   MCH 33.8  26.0 - 34.0 pg   MCHC 35.7  30.0 - 36.0 g/dL   RDW 78.4  69.6 - 29.5 %   Platelets 94 (*) 150 - 400 K/uL   Neutrophils Relative % 90 (*) 43 - 77 %   Neutro Abs 13.2 (*) 1.7 - 7.7 K/uL   Lymphocytes Relative 5 (*) 12 - 46 %   Lymphs Abs 0.8  0.7 - 4.0 K/uL   Monocytes Relative 5  3 - 12 %   Monocytes Absolute 0.7  0.1 - 1.0 K/uL   Eosinophils Relative 0  0 - 5 %   Eosinophils Absolute 0.0  0.0 - 0.7 K/uL   Basophils Relative 0  0 - 1 %   Basophils Absolute 0.0  0.0 - 0.1 K/uL   Ct Abdomen Pelvis W Contrast  09/27/2012   *RADIOLOGY REPORT*  Clinical Data: Abdominal pain, HIV.  Nausea vomiting diarrhea  CT ABDOMEN AND PELVIS WITH CONTRAST  Technique:  Multidetector CT imaging of the abdomen and pelvis was performed following the standard protocol during bolus administration of intravenous contrast.  Contrast: 25mL OMNIPAQUE IOHEXOL 300 MG/ML  SOLN, 25mL OMNIPAQUE IOHEXOL 300 MG/ML  SOLN, OMNIPAQUE IOHEXOL 300 MG/ML  SOLN  Comparison: None.  Findings: Lung bases are clear.  Liver gallbladder and bile ducts are normal.  The pancreas spleen and kidneys are normal.  There is thickening of the gastric antrum which is circumferential without focal mass lesion.  Gastric body is normal.  Negative for small bowel obstruction.  There is sigmoid diverticulosis without evidence of diverticulitis.  No abscess mass or free fluid is seen.  The appendix is diffusely dilated and filled with fluid and gas. The appendix   wall does not appear to be thickened.  There is minimal stranding in the periappendiceal fat at the base the appendix.  IMPRESSION: Sigmoid diverticulosis without diverticulitis.  The appendix is prominent size but could be within normal  limits. There is slight periappendiceal stranding which could represent early appendicitis.  Correlation with  symptom location is suggested.  Thickening of the gastric antrum which may represent gastritis. Gastric tumor considered less likely given the finding  which is relatively mild.   Original Report Authenticated By: Janeece Riggers, M.D.    7:18 PM CT of abdomne/pelvis suggests early appendicitis.  Will call [facility].  7:26 PM Case discussed with Dr. Michaell Cowing, who will see pt in the ED to consult on pt.  Call to Dr. Jerelyn Scott, ED physician at [facility] ED, who accepts pt in transfer.    1. Acute appendicitis    I personally performed the services described in this documentation, which was scribed in my presence. The recorded information has been reviewed and is accurate.  Osvaldo Human, MD    Carleene Cooper III, MD 09/27/12 1929     Carleene Cooper III, MD 09/27/12 [id number]

## 2012-09-27 NOTE — ED Notes (Signed)
Pt c/o sudden onset this a.m. of generalized abd pain. +N/V/D

## 2012-09-27 NOTE — Consult Note (Addendum)
Russell Cooper  1970/04/24 [id number]  CARE TEAM:  PCP: Acey Lav, MD  Outpatient Care Team: Patient Care Team: Randall Hiss, MD as PCP - General (Infectious Diseases) Randall Hiss, MD as PCP - Infectious Diseases (Infectious Diseases)  Inpatient Treatment Team: Treatment Team: Attending Provider: Carleene Cooper III, MD  This patient is a 42 y.o.male who presents today for surgical evaluation at the request of Dr. Ignacia Palma.   Reason for evaluation:  Appendicitis  HIV positive male with c/o abdominal pain, nausea & diarrhea today.   Sudden sharp lower palpable pain.  Had severe nausea vomiting and retching.  Has some persistent lower abdominal pain.  Some soreness.  Had salad for lunch with a friend.  Friend did not get sick.  Just had coffee for breakfast.  Ate at the [facility] last night.  No problems with that. No personal nor family history of GI/colon cancer, inflammatory bowel disease, irritable bowel syndrome, allergy such as Celiac Sprue, dietary/dairy problems, colitis, ulcers nor gastritis.  No recent sick contacts/gastroenteritis.  No travel outside the country.  No changes in diet.  Does has a history of reflux.  Usually takes omeprazole for that.  Does not take nonsteroidals or aspirin.  No history of ulcers or gastritis that he can recall.  No history of hepatitis.  Denies any alcohol drinking for the past several days.  No history of binge drinking.   Had not tried any TUMS yet when started vomiting.  No hematemesis.  No coffee-ground emesis.  No hematochezia.  No melena.  No problems eating most foods.  No right upper quadrant pain radiating to her back or shoulder.  Relatively active.  Came to the ED.   Fever >102F.  Found to have diffuse abdominal pain.  CT scan shows inflamed gastric wall concerning for gastritis.  Also appendix upper limits normal.  Dr. Ignacia Palma convinced patient has appendicitis & requests surgical admission.  I offered surgical  consultation.  Patient transferred to ED at [facility].  His last narcotic dose was four hours ago.  He feels tired and sore from vomiting, but he feels better overall.  Appetite still down but not nauseated.  Tolerated the ride over without problems.  He is here with a friend  Past Medical History  Diagnosis Date  . HIV (human immunodeficiency virus infection)   . Hypercholesteremia     History reviewed. No pertinent past surgical history.  History   Social History  . Marital Status: Single    Spouse Name: N/A    Number of Children: N/A  . Years of Education: N/A   Occupational History  . Not on file.   Social History Main Topics  . Smoking status: Current Every Day Smoker -- 10.00 packs/day    Types: Cigarettes  . Smokeless tobacco: Never Used  . Alcohol Use: Yes  . Drug Use: No  . Sexually Active: Yes   Other Topics Concern  . Not on file   Social History Narrative  . No narrative on file    History reviewed. No pertinent family history.  Current Facility-Administered Medications  Medication Dose Route Frequency Provider Last Rate Last Dose  . 0.9 %  sodium chloride infusion   Intravenous Continuous Russell Cooper III, MD 250 mL/hr at 09/27/12 1722     Current Outpatient Prescriptions  Medication Sig Dispense Refill  . aspirin 81 MG tablet Take 81 mg by mouth daily.       Marland Kitchen atorvastatin (LIPITOR)  20 MG tablet Take 1 tablet (20 mg total) by mouth daily.  90 tablet  4  . ATRIPLA 600-200-300 MG per tablet TAKE 1 TABLET BY MOUTH AT BEDTIME  30 tablet  9  . niacin (NIASPAN) 1000 MG CR tablet Take 2 tablets (2,000 mg total) by mouth at bedtime.  60 tablet  11  . Omega-3 Fatty Acids (FISH OIL) 1200 MG CAPS Take 3,600 mg by mouth daily.        Marland Kitchen omeprazole (PRILOSEC) 20 MG capsule Take 1 capsule (20 mg total) by mouth daily.  90 capsule  4     No Known Allergies  ROS: Constitutional:  No fevers, chills, sweats.  Weight stable Eyes:  No vision changes, No  discharge HENT:  No sore throats, nasal drainage Lymph: No neck swelling, No bruising easily Pulmonary:  No cough, productive sputum CV: No orthopnea, PND  Patient walks 30 minutes for about 1 miles without difficulty.  No exertional chest/neck/shoulder/arm pain. GI: No personal nor family history of GI/colon cancer, inflammatory bowel disease, irritable bowel syndrome, allergy such as Celiac Sprue, dietary/dairy problems, colitis, ulcers nor gastritis.  No recent sick contacts/gastroenteritis.  No travel outside the country.  No changes in diet. Renal: No UTIs, No hematuria.  No foul-colored urine.  No pyuira. Genital:  No drainage, bleeding, masses Musculoskeletal: No severe joint pain.  Good ROM major joints Skin:  No sores or lesions.  No rashes Heme/Lymph:  No easy bleeding.  No swollen lymph nodes Neuro: No focal weakness/numbness.  No seizures Psych: No suicidal ideation.  No hallucinations  BP 116/64  Pulse 99  Temp(Src) 100.2 F (37.9 C) (Oral)  Resp 18  Ht 5\' 9"  (1.753 m)  Wt 190 lb (86.183 kg)  BMI 28.05 kg/m2  SpO2 96%  Physical Exam: General: Pt awake &oriented x4 in no major acute distress.  Tired but not toxic.  Not sickly.  Moves around in bed easily.  Slightly restless Eyes: PERRL, normal EOM. Sclera nonicteric Neuro: CN II-XII intact w/o focal sensory/motor deficits. Lymph: No head/neck/groin lymphadenopathy Psych:  No delerium/psychosis/paranoia HENT: Normocephalic, Mucus membranes moist.  No thrush Neck: Supple, No tracheal deviation Chest: No pain.  Good respiratory excursion. CV:  Pulses intact.  Regular rhythm Abdomen: Soft, Nondistended.  Minimal tenderness on right flank>RLQ = LQ.  Not at McBurney's point.  No Murphy sign.  No pain with cough/bed shake/percussion.  No epigastric tenderness.  No tenderness elsewhere.  Negative psoas/Rosvig/obturator signs.  Nontender.  No incarcerated hernias. Ext:  SCDs BLE.  No significant edema.  No cyanosis Skin: Very  warm.  Somewhat flushed.  No petechiae / purpurea.  No major sores Musculoskeletal: No severe joint pain.  Good ROM major joints   Results:   Labs: Results for orders placed during the hospital encounter of 09/27/12 (from the past 48 hour(s))  COMPREHENSIVE METABOLIC PANEL     Status: Abnormal   Collection Time    09/27/12  5:15 PM      Result Value Range   Sodium 138  135 - 145 mEq/L   Potassium 3.3 (*) 3.5 - 5.1 mEq/L   Chloride 99  96 - 112 mEq/L   CO2 25  19 - 32 mEq/L   Glucose, Bld 123 (*) 70 - 99 mg/dL   BUN 8  6 - 23 mg/dL   Creatinine, Ser 1.61  0.50 - 1.35 mg/dL   Calcium 9.3  8.4 - 09.6 mg/dL   Total Protein 7.8  6.0 - 8.3 g/dL  Albumin 4.5  3.5 - 5.2 g/dL   AST 21  0 - 37 U/L   ALT 22  0 - 53 U/L   Alkaline Phosphatase 61  39 - 117 U/L   Total Bilirubin 0.8  0.3 - 1.2 mg/dL   GFR calc non Af Amer >90  >90 mL/min   GFR calc Af Amer >90  >90 mL/min   Comment:            The eGFR has been calculated     using the CKD EPI equation.     This calculation has not been     validated in all clinical     situations.     eGFR's persistently     <90 mL/min signify     possible Chronic Kidney Disease.  LIPASE, BLOOD     Status: None   Collection Time    09/27/12  5:15 PM      Result Value Range   Lipase 15  11 - 59 U/L  CBC WITH DIFFERENTIAL     Status: Abnormal   Collection Time    09/27/12  5:15 PM      Result Value Range   WBC 14.7 (*) 4.0 - 10.5 K/uL   RBC 4.47  4.22 - 5.81 MIL/uL   Hemoglobin 15.1  13.0 - 17.0 g/dL   HCT 16.1  09.6 - 04.5 %   MCV 94.6  78.0 - 100.0 fL   MCH 33.8  26.0 - 34.0 pg   MCHC 35.7  30.0 - 36.0 g/dL   RDW 40.9  81.1 - 91.4 %   Platelets 94 (*) 150 - 400 K/uL   Comment: PLATELET COUNT CONFIRMED BY SMEAR     LARGE PLATELETS PRESENT   Neutrophils Relative % 90 (*) 43 - 77 %   Neutro Abs 13.2 (*) 1.7 - 7.7 K/uL   Lymphocytes Relative 5 (*) 12 - 46 %   Lymphs Abs 0.8  0.7 - 4.0 K/uL   Monocytes Relative 5  3 - 12 %   Monocytes  Absolute 0.7  0.1 - 1.0 K/uL   Eosinophils Relative 0  0 - 5 %   Eosinophils Absolute 0.0  0.0 - 0.7 K/uL   Basophils Relative 0  0 - 1 %   Basophils Absolute 0.0  0.0 - 0.1 K/uL  URINALYSIS, ROUTINE W REFLEX MICROSCOPIC     Status: Abnormal   Collection Time    09/27/12  6:23 PM      Result Value Range   Color, Urine AMBER (*) YELLOW   Comment: BIOCHEMICALS MAY BE AFFECTED BY COLOR   APPearance CLEAR  CLEAR   Specific Gravity, Urine 1.024  1.005 - 1.030   pH 8.0  5.0 - 8.0   Glucose, UA NEGATIVE  NEGATIVE mg/dL   Hgb urine dipstick NEGATIVE  NEGATIVE   Bilirubin Urine SMALL (*) NEGATIVE   Ketones, ur 40 (*) NEGATIVE mg/dL   Protein, ur NEGATIVE  NEGATIVE mg/dL   Urobilinogen, UA 1.0  0.0 - 1.0 mg/dL   Nitrite NEGATIVE  NEGATIVE   Leukocytes, UA NEGATIVE  NEGATIVE   Comment: MICROSCOPIC NOT DONE ON URINES WITH NEGATIVE PROTEIN, BLOOD, LEUKOCYTES, NITRITE, OR GLUCOSE <1000 mg/dL.    Imaging / Studies: Ct Abdomen Pelvis W Contrast  09/27/2012   *RADIOLOGY REPORT*  Clinical Data: Abdominal pain, HIV.  Nausea vomiting diarrhea  CT ABDOMEN AND PELVIS WITH CONTRAST  Technique:  Multidetector CT imaging of the abdomen and pelvis was performed following  the standard protocol during bolus administration of intravenous contrast.  Contrast: 25mL OMNIPAQUE IOHEXOL 300 MG/ML  SOLN, 25mL OMNIPAQUE IOHEXOL 300 MG/ML  SOLN, OMNIPAQUE IOHEXOL 300 MG/ML  SOLN  Comparison: None.  Findings: Lung bases are clear.  Liver gallbladder and bile ducts are normal.  The pancreas spleen and kidneys are normal.  There is thickening of the gastric antrum which is circumferential without focal mass lesion.  Gastric body is normal.  Negative for small bowel obstruction.  There is sigmoid diverticulosis without evidence of diverticulitis.  No abscess mass or free fluid is seen.  The appendix is diffusely dilated and filled with fluid and gas. The appendix   wall does not appear to be thickened.  There is minimal  stranding in the periappendiceal fat at the base the appendix.  IMPRESSION: Sigmoid diverticulosis without diverticulitis.  The appendix is prominent size but could be within normal limits. There is slight periappendiceal stranding which could represent early appendicitis.  Correlation with  symptom location is suggested.  Thickening of the gastric antrum which may represent gastritis. Gastric tumor considered less likely given the finding  which is relatively mild.   Original Report Authenticated By: Janeece Riggers, M.D.    Medications / Allergies: per chart  Antibiotics: Anti-infectives   None      Assessment  Russell Cooper  42 y.o. male       Problem List:  Active Problems:   HIV INFECTION   GERD   Nausea & vomiting   Abdominal pain, bilateral lower quadrant   Diarrhea   Fever   Nausea vomiting and lower abdominal pain with some loose stools.  Favor gastroenteritis over appendicitis.  Plan:  I long discussion with the patient and his friend.  I also discussed with the emergency room team.  Given the early high fever, diffuse pain, diarrhea, and improvement in nausea/vomiting/pain; I favor gastroenteritis at this time.  CAT scan does not have any strong evidence of appendicitis.  No stranding to my evaluation.  No thickening of the appendix.  Gas and fluid seen in the appendix which is normal.  No fluid/abscess/perforation  I am more convinced of gastroenteritis or food poisoning.  I recommended IV fluid boluses.  I will order two more liters now.  Try to follow this off narcotics to get a true sense of his recovery.  Palliate fever if okay with medicine/emergency teams  Consider by mouth trial.  -control GERD  Given high fever and HIV-positive status, I suspect he would benefit from admission and observation.  Would favor medicine admission with surgical consultation given the fact is HIV-positive and history and physical not classic for appendicitis at this time.   Certainly we will follow and reevaluate.  If physical exam more concerning for appendicitis and worsening abdominal pain, consider diagnostic laparoscopy tomorrow.  If pain continues to improve, consider advancing diet with bowel regimen.  -VTE prophylaxis- SCDs, etc  -mobilize as tolerated to help recovery    Ardeth Sportsman, M.D., F.A.C.S. Gastrointestinal and Minimally Invasive Surgery [facility] [address] [place], [place] [address] [phone] Main / Paging   09/27/2012

## 2012-09-27 NOTE — ED Notes (Signed)
[facility] at bedside preparing patient for transport.  

## 2012-09-27 NOTE — ED Notes (Signed)
Report given to [place], [employer]. ETA 25 min.

## 2012-09-27 NOTE — ED Notes (Signed)
MD at bedside. 

## 2012-09-27 NOTE — ED Notes (Signed)
Patient transferred to [facility] Emergency Department Room 22 via [facility].

## 2012-09-27 NOTE — ED Provider Notes (Addendum)
Medical Screening Exam:  Patient transferred from [facility] for surgical evaluation of possible early acute appendicitis. Has been experiencing diffuse lower abdominal pain which began earlier today. There has been an episode of vomiting and patient feels somewhat better after that. He was given pain medication prior to transfer to the ER, patient reports pain is well controlled now.  Exam: Gen - comfortable HEENT - PERRLA Resp - CTAB Heart - RRR, no murmur Abd - soft, lower abd tenderness, R>L, no guarding or rebound Neuro - alert, oriented, no deficit  Assessment - 1. abdominal pain, possible early appendicitis 2. Fever 3. HIV  Plan - Doctor Gross has seen and evaluated the patient. At this time, he has not convinced that the patient has acute appendicitis. CAT scan findings were equivocal and the patient's exam does not show any evidence of peritonitis. He does not want to operate tonight. He is also concerned that the patient has had high fever and is HIV positive, asks that the patient be admitted to medicine to make sure we are not dealing with another HIV related cause of the patient's fever and abdominal pain, as he is not convinced that it is appendicitis. Patient will be seen again in the morning by surgery.  Gilda Crease, MD 09/27/12 1610  Gilda Crease, MD 09/27/12 2221

## 2012-09-27 NOTE — H&P (Addendum)
PCP:   Acey Lav, MD   Chief Complaint:  Fever, abd pain  HPI: 42 yo male hiv positive with recent undetectable viral load and cd4 count of over 900 sent from [facility] for sudden onset of lower abd pain, with associated n/v and loose stools earlier this afternoon.  Fever started today.  Has been in his normal state of health until this started mid afternoon.  No blood in vomit or stool.  Pain is generally in both lower abd quadrants but does localize more to the right side.  Still has his appendix.  Ct scan was done which was inconclusive for appendicitis.  He has had no sick contacts that he knows of in the last several days.  His partner who is present with him now is also not sick.  No dysuria, no cough.  No previous h/o murmur.  Otherwise healthy besides hiv status.  General surgery has already seen patient and wishes close observation for now for possible early appendicitis vs viral gastroenteritis.  Review of Systems:  Positive and negative as per HPI otherwise all other systems are negative  Past Medical History: Past Medical History  Diagnosis Date  . HIV (human immunodeficiency virus infection)   . Hypercholesteremia    History reviewed. No pertinent past surgical history.  Medications: Prior to Admission medications   Medication Sig Start Date End Date Taking? Authorizing Provider  aspirin 81 MG tablet Take 81 mg by mouth daily.    Yes Historical Provider, MD  atorvastatin (LIPITOR) 20 MG tablet Take 1 tablet (20 mg total) by mouth daily. 06/18/12  Yes Randall Hiss, MD  ATRIPLA 600-200-300 MG per tablet TAKE 1 TABLET BY MOUTH AT BEDTIME 01/14/12  Yes Randall Hiss, MD  niacin (NIASPAN) 1000 MG CR tablet Take 2 tablets (2,000 mg total) by mouth at bedtime. 02/27/12  Yes Randall Hiss, MD  Omega-3 Fatty Acids (FISH OIL) 1200 MG CAPS Take 3,600 mg by mouth daily.     Yes Historical Provider, MD  omeprazole (PRILOSEC) 20 MG capsule  Take 1 capsule (20 mg total) by mouth daily. 10/07/11  Yes Randall Hiss, MD    Allergies:  No Known Allergies  Social History:  reports that he has been smoking Cigarettes.  He has been smoking about 10.00 packs per day. He has never used smokeless tobacco. He reports that  drinks alcohol. He reports that he does not use illicit drugs.  Family History: History reviewed. No pertinent family history.  Physical Exam: Filed Vitals:   09/27/12 1730 09/27/12 1936 09/27/12 2031 09/27/12 2106  BP: 122/61 116/64  143/74  Pulse: 99 99  103  Temp: 98.7 F (37.1 C) 102 F (38.9 C) 100.2 F (37.9 C) 102.6 F (39.2 C)  TempSrc: Oral Oral Oral Oral  Resp: 20 18  18   Height:      Weight:      SpO2: 97% 96%  95%   General appearance: alert, cooperative and no distress Head: Normocephalic, without obvious abnormality, atraumatic Eyes: negative Nose: Nares normal. Septum midline. Mucosa normal. No drainage or sinus tenderness. Neck: no JVD and supple, symmetrical, trachea midline Lungs: clear to auscultation bilaterally Heart: regular rate and rhythm, S1, S2 normal and systolic murmur: early systolic 2/6, blowing at 2nd left intercostal space Abdomen: abnormal findings:  ttp rlq, soft, nd, pos bs no r/g nonacute abd exam Extremities: extremities normal, atraumatic, no cyanosis or edema Pulses: 2+ and  symmetric Skin: Skin color, texture, turgor normal. No rashes or lesions Neurologic: Grossly normal    Labs on Admission:   Recent Labs  09/27/12 1715  NA 138  K 3.3*  CL 99  CO2 25  GLUCOSE 123*  BUN 8  CREATININE 0.70  CALCIUM 9.3    Recent Labs  09/27/12 1715  AST 21  ALT 22  ALKPHOS 61  BILITOT 0.8  PROT 7.8  ALBUMIN 4.5    Recent Labs  09/27/12 1715  LIPASE 15    Recent Labs  09/27/12 1715  WBC 14.7*  NEUTROABS 13.2*  HGB 15.1  HCT 42.3  MCV 94.6  PLT 94*    Radiological Exams on Admission: Ct Abdomen Pelvis W Contrast  09/27/2012    *RADIOLOGY REPORT*  Clinical Data: Abdominal pain, HIV.  Nausea vomiting diarrhea  CT ABDOMEN AND PELVIS WITH CONTRAST  Technique:  Multidetector CT imaging of the abdomen and pelvis was performed following the standard protocol during bolus administration of intravenous contrast.  Contrast: 25mL OMNIPAQUE IOHEXOL 300 MG/ML  SOLN, 25mL OMNIPAQUE IOHEXOL 300 MG/ML  SOLN, OMNIPAQUE IOHEXOL 300 MG/ML  SOLN  Comparison: None.  Findings: Lung bases are clear.  Liver gallbladder and bile ducts are normal.  The pancreas spleen and kidneys are normal.  There is thickening of the gastric antrum which is circumferential without focal mass lesion.  Gastric body is normal.  Negative for small bowel obstruction.  There is sigmoid diverticulosis without evidence of diverticulitis.  No abscess mass or free fluid is seen.  The appendix is diffusely dilated and filled with fluid and gas. The appendix   wall does not appear to be thickened.  There is minimal stranding in the periappendiceal fat at the base the appendix.  IMPRESSION: Sigmoid diverticulosis without diverticulitis.  The appendix is prominent size but could be within normal limits. There is slight periappendiceal stranding which could represent early appendicitis.  Correlation with  symptom location is suggested.  Thickening of the gastric antrum which may represent gastritis. Gastric tumor considered less likely given the finding  which is relatively mild.   Original Report Authenticated By: Janeece Riggers, M.D.    Assessment/Plan 42 yo male hiv positive with lower abd pain, n/v/d/fever  Principal Problem:   Fever Active Problems:   HIV INFECTION   GERD   Nausea & vomiting   Abdominal pain, bilateral lower quadrant   Diarrhea  Early acute appendicitis vs vge.  Supportive care at this time.  Minimize pain meds.  gen surgery will eval again in am.  ivf overnight.  No abx at this time.  Recent viral load and cd4 counts reassuring.  ua is pending, if  positive start abx.  Full code.  Place on tele floor.  Amarri Michaelson A 09/27/2012, 10:22 PM   Also for murmur, ck cardiac echo.

## 2012-09-27 NOTE — ED Notes (Signed)
Pt arrived via [facility], [facility], pain 2/10. Dr. Karma Ganja notified of patient arrival, Dr. Michaell Cowing paged.

## 2012-09-28 ENCOUNTER — Encounter (HOSPITAL_COMMUNITY): Payer: Self-pay | Admitting: Internal Medicine

## 2012-09-28 DIAGNOSIS — R112 Nausea with vomiting, unspecified: Secondary | ICD-10-CM

## 2012-09-28 DIAGNOSIS — K5289 Other specified noninfective gastroenteritis and colitis: Secondary | ICD-10-CM

## 2012-09-28 DIAGNOSIS — R739 Hyperglycemia, unspecified: Secondary | ICD-10-CM | POA: Diagnosis present

## 2012-09-28 DIAGNOSIS — R509 Fever, unspecified: Secondary | ICD-10-CM

## 2012-09-28 DIAGNOSIS — R011 Cardiac murmur, unspecified: Secondary | ICD-10-CM | POA: Insufficient documentation

## 2012-09-28 DIAGNOSIS — E876 Hypokalemia: Secondary | ICD-10-CM | POA: Diagnosis present

## 2012-09-28 DIAGNOSIS — R197 Diarrhea, unspecified: Secondary | ICD-10-CM

## 2012-09-28 LAB — CBC
HCT: 37.2 % — ABNORMAL LOW (ref 39.0–52.0)
Hemoglobin: 12.7 g/dL — ABNORMAL LOW (ref 13.0–17.0)
MCH: 32 pg (ref 26.0–34.0)
MCV: 93.7 fL (ref 78.0–100.0)
RBC: 3.97 MIL/uL — ABNORMAL LOW (ref 4.22–5.81)
WBC: 15.6 10*3/uL — ABNORMAL HIGH (ref 4.0–10.5)

## 2012-09-28 LAB — BASIC METABOLIC PANEL
CO2: 26 mEq/L (ref 19–32)
Chloride: 99 mEq/L (ref 96–112)
Glucose, Bld: 142 mg/dL — ABNORMAL HIGH (ref 70–99)
Sodium: 134 mEq/L — ABNORMAL LOW (ref 135–145)

## 2012-09-28 MED ORDER — SODIUM CHLORIDE 0.9 % IJ SOLN
3.0000 mL | Freq: Two times a day (BID) | INTRAMUSCULAR | Status: DC
  Administered 2012-09-28 – 2012-10-07 (×5): 3 mL via INTRAVENOUS

## 2012-09-28 MED ORDER — ONDANSETRON HCL 4 MG/2ML IJ SOLN
4.0000 mg | Freq: Four times a day (QID) | INTRAMUSCULAR | Status: DC | PRN
  Administered 2012-09-28 – 2012-10-05 (×10): 4 mg via INTRAVENOUS
  Filled 2012-09-28 (×11): qty 2

## 2012-09-28 MED ORDER — METRONIDAZOLE IN NACL 5-0.79 MG/ML-% IV SOLN
500.0000 mg | Freq: Three times a day (TID) | INTRAVENOUS | Status: DC
  Administered 2012-09-28 – 2012-09-30 (×6): 500 mg via INTRAVENOUS
  Administered 2012-09-30: 500 g via INTRAVENOUS
  Filled 2012-09-28 (×9): qty 100

## 2012-09-28 MED ORDER — HYDROMORPHONE HCL PF 1 MG/ML IJ SOLN
0.5000 mg | INTRAMUSCULAR | Status: DC | PRN
  Administered 2012-09-28 – 2012-09-29 (×5): 0.5 mg via INTRAVENOUS
  Filled 2012-09-28 (×5): qty 1

## 2012-09-28 MED ORDER — FAMOTIDINE IN NACL 20-0.9 MG/50ML-% IV SOLN
20.0000 mg | Freq: Two times a day (BID) | INTRAVENOUS | Status: DC
  Administered 2012-09-28 – 2012-09-29 (×3): 20 mg via INTRAVENOUS
  Filled 2012-09-28 (×4): qty 50

## 2012-09-28 MED ORDER — CIPROFLOXACIN IN D5W 400 MG/200ML IV SOLN
400.0000 mg | Freq: Two times a day (BID) | INTRAVENOUS | Status: DC
  Administered 2012-09-28 – 2012-09-30 (×5): 400 mg via INTRAVENOUS
  Filled 2012-09-28 (×6): qty 200

## 2012-09-28 NOTE — Progress Notes (Signed)
Will re-examine in AM.  Depending on next 24 hours, he may need diagnostic laparoscopy.

## 2012-09-28 NOTE — Progress Notes (Addendum)
[facility] PROGRESS NOTE  Russell Cooper:[id number] DOB: 1970/05/04 DOA: 09/27/2012 PCP: Acey Lav, MD  Assessment/Plan: Principal Problem:   Fever Active Problems:   HIV INFECTION   GERD   Nausea & vomiting   Abdominal pain, bilateral lower quadrant   Diarrhea    1. Acute gastroenteritis: Patient presented with abdominal pain, vomiting and diarrhea, without clustering of cases or recent exotic travel. Clinically, this may be consistent with an acute gastroenterilis, of unclear etiology. Wcc was elevated at 14.7, so an infective etiology is possible. Lipase is normal at 15. Managing with bowel rest, iv fluids, analgesics, antiemetics and H2RA. Added Ciprofloxacin/Flagyl to treatment. Will send of stool for C. Difficile PCR.  2. Query appendicitis: Abdominal imaging studies, revealed prominent appendiceal size, with slight periappendiceal stranding which could represent early appendicitis. Dr [name] provided surgical consultation, and appears to favor a gastroenteritis. Managing as described above, but with close observation. Surgical team is following for now.  3. Fever: Pyrexia as high as 102.6 was documented on initial evaluation. Possible etiologies are discussed in #1 above. Blood cultures are pending.  4. HIV Infection: Patient has well controlled HIV disease on HAART, and regularly follows up with Dr [name], at the ID clinic. Recent undetectable viral load and CD4 count>900. Dr [name] et al, will be notified of hospitalization.   5. GERD: Asymptomatic on H2RA. Will switch to PPI, if C. Diff ruled out.  6. Dyslipidemia: Stable on pre-admission lipid-lowering medication.    Code Status: Full Code. Family Communication:  Disposition Plan: To be determined.    Brief narrative: 42 yo male with dyslipidemia, GERD, HIV positive with recent undetectable viral load and CD4 count>900 [place] [facility] for sudden onset of lower abd  pain, with associated n/v and loose stools earlier in afternoon of 09/27/12. Fever started the same day. Has been in his normal state of health until this started mid afternoon. No blood in vomit or stool. Pain is generally in both lower abd quadrants but does localize more to the right side. Still has his appendix. CT scan was inconclusive for appendicitis. He has had no sick contacts that he knows of, in the last several days. No dysuria, no cough. No previous h/o murmur. Otherwise healthy besides HIV status. General surgery saw patient and wishes close observation for now for possible early appendicitis vs viral gastroenteritis. Admittted for further management.    Consultants:  Dr [name], surgeon.   Procedures:  CT Abdomen/Pelvis.   Antibiotics:  HAART  HPI/Subjective: No vomiting or diarrhea since admission.   Objective: Vital signs in last 24 hours: Temp:  [86 F (30 C)-102.6 F (39.2 C)] 99.4 F (37.4 C) (06/23 0645) Pulse Rate:  [77-103] 77 (06/23 0600) Resp:  [18-20] 18 (06/23 0600) BP: (114-143)/(57-86) 118/70 mmHg (06/23 0600) SpO2:  [95 %-97 %] 95 % (06/23 0600) Weight:  [86.183 kg (190 lb)-86.8 kg (191 lb 5.8 oz)] 86.8 kg (191 lb 5.8 oz) (06/22 2347) Weight change:  Last BM Date: 09/27/12  Intake/Output from previous day:       Physical Exam: General: Comfortable, alert, communicative, fully oriented, not short of breath at rest.  HEENT:  No clinical pallor, no jaundice, no conjunctival injection or discharge. Hydration is satisfactory.  NECK:  Supple, JVP not seen, no carotid bruits, no palpable lymphadenopathy, no palpable goiter. CHEST:  Clinically clear to auscultation, no wheezes, no crackles. HEART:  Sounds 1 and 2 heard, normal, regular,  no murmurs. ABDOMEN:  Full, soft, mildly to moderately tender in RUQ/RLQ, no palpable organomegaly, no palpable msses, normal bowel sounds. GENITALIA:  Not examined. LOWER EXTREMITIES:  No pitting edema, palpable  peripheral pulses. MUSCULOSKELETAL SYSTEM:  Unremarkable. CENTRAL NERVOUS SYSTEM:  No focal neurologic deficit on gross examination.  Lab Results:  Recent Labs  09/27/12 1715 09/28/12 0122  WBC 14.7* 15.6*  HGB 15.1 12.7*  HCT 42.3 37.2*  PLT 94* 86*    Recent Labs  09/27/12 1715 09/28/12 0122  NA 138 134*  K 3.3* 3.2*  CL 99 99  CO2 25 26  GLUCOSE 123* 142*  BUN 8 8  CREATININE 0.70 0.82  CALCIUM 9.3 8.4   No results found for this or any previous visit (from the past 240 hour(s)).   Studies/Results: Ct Abdomen Pelvis W Contrast  09/27/2012   *RADIOLOGY REPORT*  Clinical Data: Abdominal pain, HIV.  Nausea vomiting diarrhea  CT ABDOMEN AND PELVIS WITH CONTRAST  Technique:  Multidetector CT imaging of the abdomen and pelvis was performed following the standard protocol during bolus administration of intravenous contrast.  Contrast: 25mL OMNIPAQUE IOHEXOL 300 MG/ML  SOLN, 25mL OMNIPAQUE IOHEXOL 300 MG/ML  SOLN, OMNIPAQUE IOHEXOL 300 MG/ML  SOLN  Comparison: None.  Findings: Lung bases are clear.  Liver gallbladder and bile ducts are normal.  The pancreas spleen and kidneys are normal.  There is thickening of the gastric antrum which is circumferential without focal mass lesion.  Gastric body is normal.  Negative for small bowel obstruction.  There is sigmoid diverticulosis without evidence of diverticulitis.  No abscess mass or free fluid is seen.  The appendix is diffusely dilated and filled with fluid and gas. The appendix   wall does not appear to be thickened.  There is minimal stranding in the periappendiceal fat at the base the appendix.  IMPRESSION: Sigmoid diverticulosis without diverticulitis.  The appendix is prominent size but could be within normal limits. There is slight periappendiceal stranding which could represent early appendicitis.  Correlation with  symptom location is suggested.  Thickening of the gastric antrum which may represent gastritis. Gastric tumor  considered less likely given the finding  which is relatively mild.   Original Report Authenticated By: Janeece Riggers, M.D.    Medications: Scheduled Meds: . atorvastatin  20 mg Oral Daily  . efavirenz-emtricitabine-tenofovir  1 tablet Oral QHS  . lip balm  1 application Topical BID  . niacin  2,000 mg Oral QHS  . pantoprazole  80 mg Oral Daily  . saccharomyces boulardii  250 mg Oral BID  . sodium chloride  3 mL Intravenous Q12H   Continuous Infusions: . dextrose 5 % and 0.9% NaCl 125 mL/hr at 09/28/12 0603   PRN Meds:.acetaminophen, acetaminophen, alum & mag hydroxide-simeth, diphenhydrAMINE, HYDROmorphone (DILAUDID) injection, magic mouthwash, ondansetron (ZOFRAN) IV    LOS: 1 day   Peggye Poon,CHRISTOPHER  [facility] Pager [phone]. If 8PM-8AM, please contact night-coverage at [email or web address], password [facility] 09/28/2012, 7:16 AM  LOS: 1 day

## 2012-09-28 NOTE — Progress Notes (Signed)
Patient ID: Russell Cooper, male   DOB: 1970-05-14, 42 y.o.   MRN: [id number]    Subjective: Pt feels better today.  Some pain, but less.  No nausea.   Objective: Vital signs in last 24 hours: Temp:  [86 F (30 C)-102.6 F (39.2 C)] 99.4 F (37.4 C) (06/23 0645) Pulse Rate:  [77-103] 77 (06/23 0600) Resp:  [18-20] 18 (06/23 0600) BP: (114-143)/(57-86) 118/70 mmHg (06/23 0600) SpO2:  [95 %-97 %] 95 % (06/23 0600) Weight:  [190 lb (86.183 kg)-191 lb 5.8 oz (86.8 kg)] 191 lb 5.8 oz (86.8 kg) (06/22 2347) Last BM Date: 09/27/12  Intake/Output from previous day:   Intake/Output this shift:    PE: Abd: soft, Nd, tender on RUQ and some RLQ, greatest in RUQ.  Lab Results:   Recent Labs  09/27/12 1715 09/28/12 0122  WBC 14.7* 15.6*  HGB 15.1 12.7*  HCT 42.3 37.2*  PLT 94* 86*   BMET  Recent Labs  09/27/12 1715 09/28/12 0122  NA 138 134*  K 3.3* 3.2*  CL 99 99  CO2 25 26  GLUCOSE 123* 142*  BUN 8 8  CREATININE 0.70 0.82  CALCIUM 9.3 8.4   PT/INR  Recent Labs  09/28/12 0122  LABPROT 14.1  INR 1.10   CMP     Component Value Date/Time   NA 134* 09/28/2012 0122   K 3.2* 09/28/2012 0122   CL 99 09/28/2012 0122   CO2 26 09/28/2012 0122   GLUCOSE 142* 09/28/2012 0122   BUN 8 09/28/2012 0122   CREATININE 0.82 09/28/2012 0122   CREATININE 0.91 06/03/2012 0951   CALCIUM 8.4 09/28/2012 0122   PROT 7.8 09/27/2012 1715   ALBUMIN 4.5 09/27/2012 1715   AST 21 09/27/2012 1715   ALT 22 09/27/2012 1715   ALKPHOS 61 09/27/2012 1715   BILITOT 0.8 09/27/2012 1715   GFRNONAA >90 09/28/2012 0122   GFRAA >90 09/28/2012 0122   Lipase     Component Value Date/Time   LIPASE 15 09/27/2012 1715       Studies/Results: Ct Abdomen Pelvis W Contrast  09/27/2012   *RADIOLOGY REPORT*  Clinical Data: Abdominal pain, HIV.  Nausea vomiting diarrhea  CT ABDOMEN AND PELVIS WITH CONTRAST  Technique:  Multidetector CT imaging of the abdomen and pelvis was performed following the standard  protocol during bolus administration of intravenous contrast.  Contrast: 25mL OMNIPAQUE IOHEXOL 300 MG/ML  SOLN, 25mL OMNIPAQUE IOHEXOL 300 MG/ML  SOLN, OMNIPAQUE IOHEXOL 300 MG/ML  SOLN  Comparison: None.  Findings: Lung bases are clear.  Liver gallbladder and bile ducts are normal.  The pancreas spleen and kidneys are normal.  There is thickening of the gastric antrum which is circumferential without focal mass lesion.  Gastric body is normal.  Negative for small bowel obstruction.  There is sigmoid diverticulosis without evidence of diverticulitis.  No abscess mass or free fluid is seen.  The appendix is diffusely dilated and filled with fluid and gas. The appendix   wall does not appear to be thickened.  There is minimal stranding in the periappendiceal fat at the base the appendix.  IMPRESSION: Sigmoid diverticulosis without diverticulitis.  The appendix is prominent size but could be within normal limits. There is slight periappendiceal stranding which could represent early appendicitis.  Correlation with  symptom location is suggested.  Thickening of the gastric antrum which may represent gastritis. Gastric tumor considered less likely given the finding  which is relatively mild.   Original Report Authenticated By:  Janeece Riggers, M.D.    Anti-infectives: Anti-infectives   Start     Dose/Rate Route Frequency Ordered Stop   09/28/12 0900  ciprofloxacin (CIPRO) IVPB 400 mg     400 mg 200 mL/hr over 60 Minutes Intravenous Every 12 hours 09/28/12 0742     09/28/12 0800  metroNIDAZOLE (FLAGYL) IVPB 500 mg     500 mg 100 mL/hr over 60 Minutes Intravenous Every 8 hours 09/28/12 0742     09/27/12 2300  efavirenz-emtricitabine-tenofovir (ATRIPLA) 600-200-300 MG per tablet 1 tablet     1 tablet Oral Daily at bedtime 09/27/12 2210         Assessment/Plan  1. Abdominal pain, ? Gastroenteritis  Plan: 1. WBC is up a little bit, but feels better today.  No nausea.  Pain worse in RUQ.  Will cont to  follow, but do not feel he has appendicitis right now. 2. Could probably have some clears.   LOS: 1 day    Halvor Behrend E 09/28/2012, 10:02 AM Pager: [phone]

## 2012-09-28 NOTE — Consult Note (Signed)
[facility]    Date of Admission:  09/27/2012           Day 2 ciprofloxacin        Day 2 metronidazole       Reason for Consult: Fever, nausea, vomiting and diarrhea    Referring Physician: Dr. Ricke Hey  Principal Problem:   Fever Active Problems:   HIV INFECTION   GERD   Nausea & vomiting   Abdominal pain, bilateral lower quadrant   Diarrhea   Hyperglycemia   Hypokalemia   . atorvastatin  20 mg Oral Daily  . ciprofloxacin  400 mg Intravenous Q12H  . efavirenz-emtricitabine-tenofovir  1 tablet Oral QHS  . famotidine (PEPCID) IV  20 mg Intravenous Q12H  . lip balm  1 application Topical BID  . metronidazole  500 mg Intravenous Q8H  . niacin  2,000 mg Oral QHS  . saccharomyces boulardii  250 mg Oral BID  . sodium chloride  3 mL Intravenous Q12H    Recommendations: 1. Continue current antibiotic therapy 2. Stool for cultures and C. difficile PCR if he has more diarrhea   Assessment: Russell Cooper presents with acute gastroenteritis. Is no clear evidence of acute appendicitis. He is better today and we have not been able to collect a stool sample yet. His presentation is quite nonspecific and this could be viral illness, bacterial diarrhea or C. difficile colitis even though no: Thickening was seen on CT scan. I will continue current empiric antibiotic therapy and reevaluate tomorrow. His HIV infection is well controlled and this is not likely to be a complication of HIV.    HPI: Russell Cooper is a 42 y.o. male with well-controlled HIV infection who was in good health until noon yesterday when he developed sudden severe abdominal cramping followed by fever, nausea, vomiting and diarrhea. He has not had any sick contacts that he knows of. He has not had any recent travel outside of the state. He takes Atripla for his HIV infection and does not miss doses. His most recent CD4 count was 910 with a viral load of 21. After he became ill  yesterday he came to the urgent care Center and was transferred to the emergency department with a temperature of 102. The CT scan was reported to show slight periappendiceal stranding general surgery felt that gastroneuritis was more likely. He is feeling much better today and has not had any further nausea, vomiting or diarrhea. He says he still has slight abdominal pain, mostly in his right lower quadrant.   Review of Systems: Constitutional: positive for chills, fevers and malaise, negative for sweats and weight loss Eyes: negative Ears, nose, mouth, throat, and face: negative Respiratory: negative Cardiovascular: negative Gastrointestinal: positive for abdominal pain, change in bowel habits, diarrhea, nausea and vomiting, negative for odynophagia and reflux symptoms Genitourinary:negative  Past Medical History  Diagnosis Date  . HIV (human immunodeficiency virus infection)   . Hypercholesteremia     History  Substance Use Topics  . Smoking status: Current Every Day Smoker -- 0.50 packs/day    Types: Cigarettes  . Smokeless tobacco: Never Used  . Alcohol Use: Yes     Comment: Rarely drinks alcohol.  No recent drinking    History reviewed. No pertinent family history. No Known Allergies  OBJECTIVE: Blood pressure 118/70, pulse 77, temperature 99.4 F (37.4 C), temperature source Axillary, resp. rate 18, height 5\' 9"  (1.753 m), weight 86.8 kg (  191 lb 5.8 oz), SpO2 95.00%. General: He is alert and in no distress Skin: He has a slight sunburn but no rash Oral: No oropharyngeal lesions Lymph nodes: No palpable adenopathy Lungs: Clear Cor: Regular S1 and S2 with no murmurs Abdomen: Soft and nontender with active bowel sounds. No masses are palpable. Neuro: Alert and fully oriented with normal speech Joints and extremities: Normal Mood and affect: Normal  HIV 1 RNA Quant (copies/mL)  Date Value  06/03/2012 21   12/04/2011 <20   04/08/2011 <20      CD4 T Cell Abs (cmm)    Date Value  06/03/2012 910   12/04/2011 780   04/08/2011 580     Microbiology: No results found for this or any previous visit (from the past 240 hour(s)).  CT ABDOMEN AND PELVIS WITH CONTRAST 09/28/12  Comparison: None.    IMPRESSION:  Sigmoid diverticulosis without diverticulitis.  The appendix is prominent size but could be within normal limits.  There is slight periappendiceal stranding which could represent  early appendicitis. Correlation with symptom location is  suggested. Thickening of the gastric antrum which may represent gastritis.  Gastric tumor considered less likely given the finding which is  relatively mild.   Original Report Authenticated By: Janeece Riggers, M.D.   Cliffton Asters, MD [facility] [phone] pager   [phone] cell 09/28/2012, 9:41 AM

## 2012-09-28 NOTE — Progress Notes (Signed)
Echocardiogram 2D Echocardiogram has been performed.  Russell Cooper 09/28/2012, 12:41 PM

## 2012-09-29 ENCOUNTER — Encounter (HOSPITAL_COMMUNITY): Payer: Self-pay | Admitting: Radiology

## 2012-09-29 ENCOUNTER — Emergency Department (HOSPITAL_COMMUNITY): Payer: BC Managed Care – PPO

## 2012-09-29 DIAGNOSIS — R1032 Left lower quadrant pain: Secondary | ICD-10-CM

## 2012-09-29 DIAGNOSIS — R1031 Right lower quadrant pain: Secondary | ICD-10-CM

## 2012-09-29 LAB — COMPREHENSIVE METABOLIC PANEL
AST: 12 U/L (ref 0–37)
CO2: 26 mEq/L (ref 19–32)
Chloride: 100 mEq/L (ref 96–112)
Creatinine, Ser: 0.85 mg/dL (ref 0.50–1.35)
GFR calc non Af Amer: >90 mL/min (ref >90)
Glucose, Bld: 154 mg/dL — ABNORMAL HIGH (ref 70–99)
Total Bilirubin: 0.9 mg/dL (ref 0.3–1.2)

## 2012-09-29 LAB — CBC
HCT: 36.2 % — ABNORMAL LOW (ref 39.0–52.0)
Hemoglobin: 12.1 g/dL — ABNORMAL LOW (ref 13.0–17.0)
MCV: 94.5 fL (ref 78.0–100.0)
Platelets: 78 10*3/uL — ABNORMAL LOW (ref 150–400)
RBC: 3.83 MIL/uL — ABNORMAL LOW (ref 4.22–5.81)
WBC: 14 10*3/uL — ABNORMAL HIGH (ref 4.0–10.5)

## 2012-09-29 MED ORDER — IOHEXOL 300 MG/ML  SOLN
100.0000 mL | Freq: Once | INTRAMUSCULAR | Status: AC | PRN
  Administered 2012-09-29: 100 mL via INTRAVENOUS

## 2012-09-29 MED ORDER — HYDROMORPHONE HCL PF 1 MG/ML IJ SOLN
0.5000 mg | INTRAMUSCULAR | Status: DC | PRN
  Administered 2012-09-29: 0.5 mg via INTRAVENOUS
  Administered 2012-09-29 – 2012-09-30 (×3): 1 mg via INTRAVENOUS
  Filled 2012-09-29 (×4): qty 1

## 2012-09-29 MED ORDER — IOHEXOL 300 MG/ML  SOLN
25.0000 mL | INTRAMUSCULAR | Status: AC
  Administered 2012-09-29 (×2): 25 mL via ORAL

## 2012-09-29 MED ORDER — PANTOPRAZOLE SODIUM 40 MG IV SOLR
40.0000 mg | INTRAVENOUS | Status: DC
  Administered 2012-09-29 – 2012-10-03 (×4): 40 mg via INTRAVENOUS
  Filled 2012-09-29 (×5): qty 40

## 2012-09-29 MED ORDER — POTASSIUM CHLORIDE 10 MEQ/100ML IV SOLN
10.0000 meq | INTRAVENOUS | Status: AC
  Administered 2012-09-29 (×4): 10 meq via INTRAVENOUS
  Filled 2012-09-29 (×4): qty 100

## 2012-09-29 NOTE — Progress Notes (Signed)
Patient ID: Russell Cooper, male   DOB: June 03, 1970, 41 y.o.   MRN: [id number]    Subjective: Pt feels better today, but still has diffuse soreness.  Feels more bloated today  Objective: Vital signs in last 24 hours: Temp:  [99.2 F (37.3 C)-102 F (38.9 C)] 99.2 F (37.3 C) (06/24 0600) Pulse Rate:  [86-104] 97 (06/24 0600) Resp:  [18] 18 (06/24 0600) BP: (110-116)/(70-74) 110/73 mmHg (06/24 0600) SpO2:  [94 %-98 %] 98 % (06/24 0600) Last BM Date: 09/28/12  Intake/Output from previous day: 06/23 0701 - 06/24 0700 In: 3 [I.V.:3] Out: 850 [Urine:850] Intake/Output this shift:    PE: Abd: soft, diffuse tenderness, but no guarding, few BS, minimal distention Ht: regular Lungs: CTAB  Lab Results:   Recent Labs  09/28/12 0122 09/29/12 0515  WBC 15.6* 14.0*  HGB 12.7* 12.1*  HCT 37.2* 36.2*  PLT 86* 78*   BMET  Recent Labs  09/28/12 0122 09/29/12 0515  NA 134* 133*  K 3.2* 3.3*  CL 99 100  CO2 26 26  GLUCOSE 142* 154*  BUN 8 6  CREATININE 0.82 0.85  CALCIUM 8.4 7.9*   PT/INR  Recent Labs  09/28/12 0122  LABPROT 14.1  INR 1.10   CMP     Component Value Date/Time   NA 133* 09/29/2012 0515   K 3.3* 09/29/2012 0515   CL 100 09/29/2012 0515   CO2 26 09/29/2012 0515   GLUCOSE 154* 09/29/2012 0515   BUN 6 09/29/2012 0515   CREATININE 0.85 09/29/2012 0515   CREATININE 0.91 06/03/2012 0951   CALCIUM 7.9* 09/29/2012 0515   PROT 5.9* 09/29/2012 0515   ALBUMIN 2.7* 09/29/2012 0515   AST 12 09/29/2012 0515   ALT 15 09/29/2012 0515   ALKPHOS 41 09/29/2012 0515   BILITOT 0.9 09/29/2012 0515   GFRNONAA >90 09/29/2012 0515   GFRAA >90 09/29/2012 0515   Lipase     Component Value Date/Time   LIPASE 15 09/27/2012 1715       Studies/Results: Ct Abdomen Pelvis W Contrast  09/27/2012   *RADIOLOGY REPORT*  Clinical Data: Abdominal pain, HIV.  Nausea vomiting diarrhea  CT ABDOMEN AND PELVIS WITH CONTRAST  Technique:  Multidetector CT imaging of the abdomen and pelvis  was performed following the standard protocol during bolus administration of intravenous contrast.  Contrast: 25mL OMNIPAQUE IOHEXOL 300 MG/ML  SOLN, 25mL OMNIPAQUE IOHEXOL 300 MG/ML  SOLN, OMNIPAQUE IOHEXOL 300 MG/ML  SOLN  Comparison: None.  Findings: Lung bases are clear.  Liver gallbladder and bile ducts are normal.  The pancreas spleen and kidneys are normal.  There is thickening of the gastric antrum which is circumferential without focal mass lesion.  Gastric body is normal.  Negative for small bowel obstruction.  There is sigmoid diverticulosis without evidence of diverticulitis.  No abscess mass or free fluid is seen.  The appendix is diffusely dilated and filled with fluid and gas. The appendix   wall does not appear to be thickened.  There is minimal stranding in the periappendiceal fat at the base the appendix.  IMPRESSION: Sigmoid diverticulosis without diverticulitis.  The appendix is prominent size but could be within normal limits. There is slight periappendiceal stranding which could represent early appendicitis.  Correlation with  symptom location is suggested.  Thickening of the gastric antrum which may represent gastritis. Gastric tumor considered less likely given the finding  which is relatively mild.   Original Report Authenticated By: Janeece Riggers, M.D.  Anti-infectives: Anti-infectives   Start     Dose/Rate Route Frequency Ordered Stop   09/28/12 0900  ciprofloxacin (CIPRO) IVPB 400 mg     400 mg 200 mL/hr over 60 Minutes Intravenous Every 12 hours 09/28/12 0742     09/28/12 0800  metroNIDAZOLE (FLAGYL) IVPB 500 mg     500 mg 100 mL/hr over 60 Minutes Intravenous Every 8 hours 09/28/12 0742     09/27/12 2300  efavirenz-emtricitabine-tenofovir (ATRIPLA) 600-200-300 MG per tablet 1 tablet     1 tablet Oral Daily at bedtime 09/27/12 2210         Assessment/Plan  1. Abdominal pain, appendicitis vs gastroenteritis 2. HIV 3. Thrombocytopenia   Plan: 1. D/w Dr.  Donell Beers, we will obtain a repeat CT scan today.  His abdominal exam does not confirm appendicitis vs gastroenteritis.  He still had fevers yesterday and still has a WBC.  Prior to proceeding with a dx lap, we will get further imaging to assist with decision making.    LOS: 2 days    Dylan Ruotolo E 09/29/2012, 8:15 AM Pager: [phone]

## 2012-09-29 NOTE — Progress Notes (Signed)
Patient seen and examined.  Agree with PA's note.  

## 2012-09-29 NOTE — Progress Notes (Signed)
[facility] PROGRESS NOTE  Russell Cooper EAV:[id number] DOB: [birth year] DOA: 09/27/2012 PCP: Acey Lav, MD  Assessment/Plan: Principal Problem:   Fever Active Problems:   HIV INFECTION   GERD   Nausea & vomiting   Abdominal pain, bilateral lower quadrant   Diarrhea   Hyperglycemia   Hypokalemia    1. Acute gastroenteritis: Patient presented with abdominal pain, vomiting and diarrhea, without clustering of cases or recent exotic travel. Clinically, this may be consistent with an acute gastroenterilis, of unclear etiology. Wcc was elevated at 14.7, so an infective etiology is possible. Lipase is normal at 15. Managing with bowel rest, iv fluids, analgesics, antiemetics and H2RA. On Ciprofloxacin/Flagyl, day # 2. Stool for C. Difficile PCR is negative. Patient still has diarrheal stools today, so will send off for other stool pathogens. .  2. Query appendicitis: Abdominal imaging studies, revealed prominent appendiceal size, with slight periappendiceal stranding which could represent early appendicitis. Dr [name] provided surgical consultation, and appears to favor a gastroenteritis. Managing as described above, but with close observation. Surgical team is following, and as patient has generalized abdominal tenderness, persistent leukocytosis as well as a pyrexia of 102.3 overnight, repeat abdominal CT scan is scheduled for today.  3. Fever: Pyrexia as high as 102.6 was documented on initial evaluation. Possible etiologies are discussed in #1 above. Blood cultures are negative so far.  4. HIV Infection: Patient has well controlled HIV disease on HAART, and regularly follows up with Dr [name], at the ID clinic. Recent undetectable viral load and CD4 count>900. Dr [name] et al, were notified of hospitalization, and patient has been seen by Dr Cliffton Asters. .   5. GERD: Asymptomatic on H2RA. PPI resumed today.  6. Dyslipidemia: Stable on pre-admission lipid-lowering  medication.    Code Status: Full Code. Family Communication:  Disposition Plan: To be determined.    Brief narrative: 42 yo male with dyslipidemia, GERD, HIV positive with recent undetectable viral load and CD4 count>900 sent [facility] [facility] for sudden onset of lower abd pain, with associated n/v and loose stools earlier in afternoon of 09/27/12. Fever started the same day. Has been in his normal state of health until this started mid afternoon. No blood in vomit or stool. Pain is generally in both lower abd quadrants but does localize more to the right side. Still has his appendix. CT scan was inconclusive for appendicitis. He has had no sick contacts that he knows of, in the last several days. No dysuria, no cough. No previous h/o murmur. Otherwise healthy besides HIV status. General surgery saw patient and wishes close observation for now for possible early appendicitis vs viral gastroenteritis. Admittted for further management.    Consultants:  Dr [name], surgeon.   Procedures:  CT Abdomen/Pelvis.   Antibiotics:  HAART  Ciprofloxacin 09/28/12>>>  Flagyl 09/28/12>>>  HPI/Subjective: Abdominal pain and diarrhea persist. Had temp overnight.   Objective: Vital signs in last 24 hours: Temp:  [99.2 F (37.3 C)-102 F (38.9 C)] 99.2 F (37.3 C) (06/24 0600) Pulse Rate:  [86-104] 97 (06/24 0600) Resp:  [18] 18 (06/24 0600) BP: (110-116)/(70-74) 110/73 mmHg (06/24 0600) SpO2:  [94 %-98 %] 98 % (06/24 0600) Weight change:  Last BM Date: 09/28/12  Intake/Output from previous day: 06/23 0701 - 06/24 0700 In: 3 [I.V.:3] Out: 850 [Urine:850] Total I/O In: 1150 [IV Piggyback:1150] Out: -    Physical Exam: General: Comfortable, alert, communicative, fully oriented, not short of  breath at rest.  HEENT:  No clinical pallor, no jaundice, no conjunctival injection or discharge. Hydration is satisfactory.  NECK:  Supple, JVP not seen, no carotid  bruits, no palpable lymphadenopathy, no palpable goiter. CHEST:  Clinically clear to auscultation, no wheezes, no crackles. HEART:  Sounds 1 and 2 heard, normal, regular, no murmurs. ABDOMEN:  Full, soft, diffuse moderately tenderness, no palpable organomegaly, no palpable msses, normal bowel sounds. GENITALIA:  Not examined. LOWER EXTREMITIES:  No pitting edema, palpable peripheral pulses. MUSCULOSKELETAL SYSTEM:  Unremarkable. CENTRAL NERVOUS SYSTEM:  No focal neurologic deficit on gross examination.  Lab Results:  Recent Labs  09/28/12 0122 09/29/12 0515  WBC 15.6* 14.0*  HGB 12.7* 12.1*  HCT 37.2* 36.2*  PLT 86* 78*    Recent Labs  09/28/12 0122 09/29/12 0515  NA 134* 133*  K 3.2* 3.3*  CL 99 100  CO2 26 26  GLUCOSE 142* 154*  BUN 8 6  CREATININE 0.82 0.85  CALCIUM 8.4 7.9*   Recent Results (from the past 240 hour(s))  CULTURE, BLOOD (ROUTINE X 2)     Status: None   Collection Time    09/28/12  1:15 AM      Result Value Range Status   Specimen Description BLOOD RIGHT ARM   Final   Special Requests BOTTLES DRAWN AEROBIC AND ANAEROBIC    Final   Culture  Setup Time 09/28/2012 09:30   Final   Culture     Final   Value:        BLOOD CULTURE RECEIVED NO GROWTH TO DATE CULTURE WILL BE HELD FOR 5 DAYS BEFORE ISSUING A FINAL NEGATIVE REPORT   Report Status PENDING   Incomplete  CULTURE, BLOOD (ROUTINE X 2)     Status: None   Collection Time    09/28/12  1:22 AM      Result Value Range Status   Specimen Description BLOOD RIGHT ARM   Final   Special Requests BOTTLES DRAWN AEROBIC AND ANAEROBIC    Final   Culture  Setup Time 09/28/2012 09:30   Final   Culture     Final   Value:        BLOOD CULTURE RECEIVED NO GROWTH TO DATE CULTURE WILL BE HELD FOR 5 DAYS BEFORE ISSUING A FINAL NEGATIVE REPORT   Report Status PENDING   Incomplete  CLOSTRIDIUM DIFFICILE BY PCR     Status: None   Collection Time    09/28/12  1:59 PM      Result Value Range Status   C  difficile by pcr NEGATIVE  NEGATIVE Final     Studies/Results: Ct Abdomen Pelvis W Contrast  09/27/2012   *RADIOLOGY REPORT*  Clinical Data: Abdominal pain, HIV.  Nausea vomiting diarrhea  CT ABDOMEN AND PELVIS WITH CONTRAST  Technique:  Multidetector CT imaging of the abdomen and pelvis was performed following the standard protocol during bolus administration of intravenous contrast.  Contrast: 25mL OMNIPAQUE IOHEXOL 300 MG/ML  SOLN, 25mL OMNIPAQUE IOHEXOL 300 MG/ML  SOLN, OMNIPAQUE IOHEXOL 300 MG/ML  SOLN  Comparison: None.  Findings: Lung bases are clear.  Liver gallbladder and bile ducts are normal.  The pancreas spleen and kidneys are normal.  There is thickening of the gastric antrum which is circumferential without focal mass lesion.  Gastric body is normal.  Negative for small bowel obstruction.  There is sigmoid diverticulosis without evidence of diverticulitis.  No abscess mass or free fluid is seen.  The appendix is diffusely  dilated and filled with fluid and gas. The appendix   wall does not appear to be thickened.  There is minimal stranding in the periappendiceal fat at the base the appendix.  IMPRESSION: Sigmoid diverticulosis without diverticulitis.  The appendix is prominent size but could be within normal limits. There is slight periappendiceal stranding which could represent early appendicitis.  Correlation with  symptom location is suggested.  Thickening of the gastric antrum which may represent gastritis. Gastric tumor considered less likely given the finding  which is relatively mild.   Original Report Authenticated By: Janeece Riggers, M.D.    Medications: Scheduled Meds: . atorvastatin  20 mg Oral Daily  . ciprofloxacin  400 mg Intravenous Q12H  . efavirenz-emtricitabine-tenofovir  1 tablet Oral QHS  . famotidine (PEPCID) IV  20 mg Intravenous Q12H  . lip balm  1 application Topical BID  . metronidazole  500 mg Intravenous Q8H  . niacin  2,000 mg Oral QHS  . saccharomyces  boulardii  250 mg Oral BID  . sodium chloride  3 mL Intravenous Q12H   Continuous Infusions: . dextrose 5 % and 0.9% NaCl 125 mL/hr (09/29/12 0326)   PRN Meds:.acetaminophen, acetaminophen, alum & mag hydroxide-simeth, diphenhydrAMINE, HYDROmorphone (DILAUDID) injection, magic mouthwash, ondansetron (ZOFRAN) IV, ondansetron    LOS: 2 days   Makena Murdock,CHRISTOPHER  [facility] Pager [phone]. If 8PM-8AM, please contact night-coverage at [email or web address], password [facility] 09/29/2012, 12:02 PM  LOS: 2 days

## 2012-09-29 NOTE — Progress Notes (Signed)
Patient ID: Russell Cooper, male   DOB: 01-31-1971, 42 y.o.   MRN: [id number]         [facility] for Infectious Disease    Date of Admission:  09/27/2012           Day 3 ciprofloxacin        Day 3 metronidazole  Principal Problem:   Fever Active Problems:   HIV INFECTION   GERD   Nausea & vomiting   Abdominal pain, bilateral lower quadrant   Diarrhea   Hyperglycemia   Hypokalemia   . atorvastatin  20 mg Oral Daily  . ciprofloxacin  400 mg Intravenous Q12H  . efavirenz-emtricitabine-tenofovir  1 tablet Oral QHS  . lip balm  1 application Topical BID  . metronidazole  500 mg Intravenous Q8H  . niacin  2,000 mg Oral QHS  . pantoprazole (PROTONIX) IV  40 mg Intravenous Q24H  . potassium chloride  10 mEq Intravenous Q1 Hr x 4  . saccharomyces boulardii  250 mg Oral BID  . sodium chloride  3 mL Intravenous Q12H    Subjective: He is feeling better with less abdominal pain. He still having occasional diarrhea and he had some nausea today while drinking the contrast for his repeat CT scan.  Review of Systems: Pertinent items are noted in HPI.  Past Medical History  Diagnosis Date  . HIV (human immunodeficiency virus infection)   . Hypercholesteremia   . Undiagnosed cardiac murmurs     History  Substance Use Topics  . Smoking status: Current Every Day Smoker -- 0.50 packs/day    Types: Cigarettes  . Smokeless tobacco: Never Used  . Alcohol Use: Yes     Comment: Rarely drinks alcohol.  No recent drinking    History reviewed. No pertinent family history.  No Known Allergies  Objective: Temp:  [99.2 F (37.3 C)-102 F (38.9 C)] 99.2 F (37.3 C) (06/24 0600) Pulse Rate:  [97-104] 97 (06/24 0600) Resp:  [18] 18 (06/24 0600) BP: (110-113)/(70-73) 110/73 mmHg (06/24 0600) SpO2:  [95 %-98 %] 98 % (06/24 0600)  General: He appears more comfortable Skin: No rash Lungs: Clear Cor: Regular S1 and S2 no murmurs Abdomen: Soft and nontender   Lab  Results Lab Results  Component Value Date   WBC 14.0* 09/29/2012   HGB 12.1* 09/29/2012   HCT 36.2* 09/29/2012   MCV 94.5 09/29/2012   PLT 78* 09/29/2012    Lab Results  Component Value Date   CREATININE 0.85 09/29/2012   BUN 6 09/29/2012   NA 133* 09/29/2012   K 3.3* 09/29/2012   CL 100 09/29/2012   CO2 26 09/29/2012    Lab Results  Component Value Date   ALT 15 09/29/2012   AST 12 09/29/2012   ALKPHOS 41 09/29/2012   BILITOT 0.9 09/29/2012      HIV 1 RNA Quant (copies/mL)  Date Value  06/03/2012 21   12/04/2011 <20   04/08/2011 <20      CD4 T Cell Abs (cmm)  Date Value  06/03/2012 910   12/04/2011 780   04/08/2011 580    Microbiology: Recent Results (from the past 240 hour(s))  CULTURE, BLOOD (ROUTINE X 2)     Status: None   Collection Time    09/28/12  1:15 AM      Result Value Range Status   Specimen Description BLOOD RIGHT ARM   Final   Special Requests BOTTLES DRAWN AEROBIC AND ANAEROBIC    Final  Culture  Setup Time 09/28/2012 09:30   Final   Culture     Final   Value:        BLOOD CULTURE RECEIVED NO GROWTH TO DATE CULTURE WILL BE HELD FOR 5 DAYS BEFORE ISSUING A FINAL NEGATIVE REPORT   Report Status PENDING   Incomplete  CULTURE, BLOOD (ROUTINE X 2)     Status: None   Collection Time    09/28/12  1:22 AM      Result Value Range Status   Specimen Description BLOOD RIGHT ARM   Final   Special Requests BOTTLES DRAWN AEROBIC AND ANAEROBIC    Final   Culture  Setup Time 09/28/2012 09:30   Final   Culture     Final   Value:        BLOOD CULTURE RECEIVED NO GROWTH TO DATE CULTURE WILL BE HELD FOR 5 DAYS BEFORE ISSUING A FINAL NEGATIVE REPORT   Report Status PENDING   Incomplete  CLOSTRIDIUM DIFFICILE BY PCR     Status: None   Collection Time    09/28/12  1:59 PM      Result Value Range Status   C difficile by pcr NEGATIVE  NEGATIVE Final    Studies/Results: CT ABDOMEN AND PELVIS WITH CONTRAST 09/29/12   IMPRESSION:  1. Development of inflammation  centered about the right lower  quadrant. Concurrent appendiceal wall thickening and dilatation  with intraluminal gas. Suggestion of synchronous adjacent ileal  wall thickening. Considerations include an atypical appearance of  appendicitis (with intraluminal gas) or infectious enteritis with  secondary appendiceal inflammation. Case discussed with Dr. Donell Beers  at 2 p.m.  2. Small volume cul-de-sac fluid which is new and presumably  related to the right lower quadrant inflammatory process.  3. Development of collapse / consolidative change at the lung  bases. Infection versus atelectasis. Concurrent new small  bilateral pleural effusions.  4. Left-sided IVC.   Original Report Authenticated By: Jeronimo Greaves, M.D.    Assessment: He is improving with time empiric antibiotic therapy. His repeat CT scan is showing more localized inflammation around the appendix and ileum.  Plan: 1. Continue current antibiotics 2. Await input from general surgery  Cliffton Asters, MD [facility] for Infectious Disease [facility] [phone] pager   [phone] cell 09/29/2012, 3:12 PM

## 2012-09-30 ENCOUNTER — Encounter (HOSPITAL_COMMUNITY): Payer: Self-pay | Admitting: Certified Registered"

## 2012-09-30 ENCOUNTER — Inpatient Hospital Stay (HOSPITAL_COMMUNITY): Payer: BC Managed Care – PPO | Admitting: Anesthesiology

## 2012-09-30 ENCOUNTER — Encounter (HOSPITAL_COMMUNITY): Payer: Self-pay | Admitting: Anesthesiology

## 2012-09-30 ENCOUNTER — Encounter (HOSPITAL_COMMUNITY)
Admission: EM | Disposition: A | Discharge status: Home / Self Care | Payer: Self-pay | Source: Home / Self Care | Attending: Family Medicine

## 2012-09-30 DIAGNOSIS — K3580 Unspecified acute appendicitis: Secondary | ICD-10-CM

## 2012-09-30 HISTORY — PX: LAPAROSCOPIC APPENDECTOMY: SHX408

## 2012-09-30 LAB — CBC
HCT: 37.8 % — ABNORMAL LOW (ref 39.0–52.0)
MCV: 94.3 fL (ref 78.0–100.0)
RDW: 12.9 % (ref 11.5–15.5)
WBC: 13.6 10*3/uL — ABNORMAL HIGH (ref 4.0–10.5)

## 2012-09-30 LAB — BASIC METABOLIC PANEL
BUN: 6 mg/dL (ref 6–23)
Chloride: 99 mEq/L (ref 96–112)
Creatinine, Ser: 0.79 mg/dL (ref 0.50–1.35)
GFR calc Af Amer: >90 mL/min (ref >90)

## 2012-09-30 LAB — URINALYSIS W MICROSCOPIC + REFLEX CULTURE
Ketones, ur: NEGATIVE mg/dL
Nitrite: POSITIVE — AB
Protein, ur: 100 mg/dL — AB
pH: 6 (ref 5.0–8.0)

## 2012-09-30 LAB — GLUCOSE, CAPILLARY

## 2012-09-30 SURGERY — APPENDECTOMY, LAPAROSCOPIC
Anesthesia: General | Wound class: Dirty or Infected

## 2012-09-30 MED ORDER — LIDOCAINE HCL (PF) 1 % IJ SOLN
INTRAMUSCULAR | Status: DC | PRN
  Administered 2012-09-30: 7 mL

## 2012-09-30 MED ORDER — ONDANSETRON HCL 4 MG/2ML IJ SOLN
INTRAMUSCULAR | Status: DC | PRN
  Administered 2012-09-30: 4 mg via INTRAVENOUS

## 2012-09-30 MED ORDER — LACTATED RINGERS IV SOLN
INTRAVENOUS | Status: DC
  Administered 2012-09-30: 1000 mL via INTRAVENOUS

## 2012-09-30 MED ORDER — SUCCINYLCHOLINE CHLORIDE 20 MG/ML IJ SOLN
INTRAMUSCULAR | Status: DC | PRN
  Administered 2012-09-30: 100 mg via INTRAVENOUS

## 2012-09-30 MED ORDER — LACTATED RINGERS IV SOLN: INTRAVENOUS | Status: DC

## 2012-09-30 MED ORDER — PROMETHAZINE HCL 25 MG/ML IJ SOLN
25.0000 mg | Freq: Once | INTRAMUSCULAR | Status: AC
  Administered 2012-09-30: 25 mg via INTRAVENOUS
  Filled 2012-09-30: qty 1

## 2012-09-30 MED ORDER — STERILE WATER FOR IRRIGATION IR SOLN
Status: DC | PRN
  Administered 2012-09-30: 1500 mL

## 2012-09-30 MED ORDER — PROPOFOL 10 MG/ML IV BOLUS
INTRAVENOUS | Status: DC | PRN
  Administered 2012-09-30: 200 mg via INTRAVENOUS

## 2012-09-30 MED ORDER — FENTANYL CITRATE 0.05 MG/ML IJ SOLN
INTRAMUSCULAR | Status: DC | PRN
  Administered 2012-09-30: 50 ug via INTRAVENOUS
  Administered 2012-09-30: 100 ug via INTRAVENOUS
  Administered 2012-09-30: 50 ug via INTRAVENOUS

## 2012-09-30 MED ORDER — LACTATED RINGERS IR SOLN
Status: DC | PRN
  Administered 2012-09-30: 1

## 2012-09-30 MED ORDER — SODIUM CHLORIDE 0.9 % IV BOLUS (SEPSIS)
500.0000 mL | Freq: Once | INTRAVENOUS | Status: AC
  Administered 2012-09-30: 500 mL via INTRAVENOUS

## 2012-09-30 MED ORDER — ROCURONIUM BROMIDE 100 MG/10ML IV SOLN
INTRAVENOUS | Status: DC | PRN
  Administered 2012-09-30 (×3): 10 mg via INTRAVENOUS
  Administered 2012-09-30: 30 mg via INTRAVENOUS

## 2012-09-30 MED ORDER — BUPIVACAINE-EPINEPHRINE 0.25% -1:200000 IJ SOLN
INTRAMUSCULAR | Status: DC | PRN
  Administered 2012-09-30: 7 mL

## 2012-09-30 MED ORDER — PIPERACILLIN-TAZOBACTAM 3.375 G IVPB
3.3750 g | Freq: Three times a day (TID) | INTRAVENOUS | Status: AC
  Administered 2012-09-30 – 2012-10-07 (×21): 3.375 g via INTRAVENOUS
  Filled 2012-09-30 (×22): qty 50

## 2012-09-30 MED ORDER — HYDROMORPHONE HCL PF 1 MG/ML IJ SOLN
0.2500 mg | INTRAMUSCULAR | Status: DC | PRN
  Administered 2012-09-30: 0.5 mg via INTRAVENOUS

## 2012-09-30 MED ORDER — LACTATED RINGERS IV SOLN
INTRAVENOUS | Status: DC | PRN
  Administered 2012-09-30 (×2): via INTRAVENOUS

## 2012-09-30 MED ORDER — HYDROMORPHONE HCL PF 1 MG/ML IJ SOLN
0.5000 mg | INTRAMUSCULAR | Status: DC | PRN
  Administered 2012-09-30: 2 mg via INTRAVENOUS
  Administered 2012-09-30 (×5): 1 mg via INTRAVENOUS
  Administered 2012-10-01 (×2): 2 mg via INTRAVENOUS
  Administered 2012-10-01: 1 mg via INTRAVENOUS
  Administered 2012-10-01 (×2): 2 mg via INTRAVENOUS
  Administered 2012-10-01: 1 mg via INTRAVENOUS
  Administered 2012-10-01: 2 mg via INTRAVENOUS
  Administered 2012-10-02: 1 mg via INTRAVENOUS
  Administered 2012-10-02 (×2): 2 mg via INTRAVENOUS
  Administered 2012-10-02: 1 mg via INTRAVENOUS
  Administered 2012-10-02 (×2): 2 mg via INTRAVENOUS
  Administered 2012-10-02: 1 mg via INTRAVENOUS
  Administered 2012-10-02 (×2): 2 mg via INTRAVENOUS
  Administered 2012-10-02: 1 mg via INTRAVENOUS
  Administered 2012-10-03: 2 mg via INTRAVENOUS
  Administered 2012-10-03: 1 mg via INTRAVENOUS
  Administered 2012-10-03: 2 mg via INTRAVENOUS
  Administered 2012-10-03 (×2): 1 mg via INTRAVENOUS
  Administered 2012-10-03: 2 mg via INTRAVENOUS
  Administered 2012-10-03: 1 mg via INTRAVENOUS
  Administered 2012-10-03 (×2): 2 mg via INTRAVENOUS
  Administered 2012-10-04 – 2012-10-05 (×12): 1 mg via INTRAVENOUS
  Administered 2012-10-05: 2 mg via INTRAVENOUS
  Administered 2012-10-05 (×2): 1 mg via INTRAVENOUS
  Administered 2012-10-05: 2 mg via INTRAVENOUS
  Administered 2012-10-06 – 2012-10-07 (×3): 1 mg via INTRAVENOUS
  Filled 2012-09-30 (×4): qty 1
  Filled 2012-09-30: qty 2
  Filled 2012-09-30: qty 1
  Filled 2012-09-30: qty 2
  Filled 2012-09-30: qty 1
  Filled 2012-09-30: qty 2
  Filled 2012-09-30 (×3): qty 1
  Filled 2012-09-30: qty 2
  Filled 2012-09-30 (×11): qty 1
  Filled 2012-09-30: qty 2
  Filled 2012-09-30 (×3): qty 1
  Filled 2012-09-30: qty 2
  Filled 2012-09-30: qty 1
  Filled 2012-09-30 (×5): qty 2
  Filled 2012-09-30 (×2): qty 1
  Filled 2012-09-30 (×2): qty 2
  Filled 2012-09-30: qty 1
  Filled 2012-09-30 (×2): qty 2
  Filled 2012-09-30: qty 1
  Filled 2012-09-30 (×4): qty 2
  Filled 2012-09-30 (×3): qty 1
  Filled 2012-09-30: qty 2
  Filled 2012-09-30: qty 1

## 2012-09-30 MED ORDER — GLYCOPYRROLATE 0.2 MG/ML IJ SOLN
INTRAMUSCULAR | Status: DC | PRN
  Administered 2012-09-30: 0.6 mg via INTRAVENOUS

## 2012-09-30 MED ORDER — IBUPROFEN 600 MG PO TABS
600.0000 mg | ORAL_TABLET | Freq: Once | ORAL | Status: AC
  Administered 2012-09-30: 600 mg via ORAL
  Filled 2012-09-30: qty 1

## 2012-09-30 MED ORDER — LIDOCAINE HCL (PF) 2 % IJ SOLN
INTRAMUSCULAR | Status: DC | PRN
  Administered 2012-09-30: 20 mg

## 2012-09-30 MED ORDER — 0.9 % SODIUM CHLORIDE (POUR BTL) OPTIME
TOPICAL | Status: DC | PRN
  Administered 2012-09-30: 1000 mL

## 2012-09-30 MED ORDER — HYDROMORPHONE HCL PF 1 MG/ML IJ SOLN
INTRAMUSCULAR | Status: DC | PRN
  Administered 2012-09-30: 1 mg via INTRAVENOUS
  Administered 2012-09-30 (×2): 0.5 mg via INTRAVENOUS

## 2012-09-30 MED ORDER — PROMETHAZINE HCL 25 MG/ML IJ SOLN: 6.2500 mg | INTRAMUSCULAR | Status: DC | PRN

## 2012-09-30 MED ORDER — SODIUM CHLORIDE 0.9 % IV SOLN
INTRAVENOUS | Status: DC
  Administered 2012-09-30 – 2012-10-04 (×8): via INTRAVENOUS

## 2012-09-30 MED ORDER — NEOSTIGMINE METHYLSULFATE 1 MG/ML IJ SOLN
INTRAMUSCULAR | Status: DC | PRN
  Administered 2012-09-30: 4 mg via INTRAVENOUS

## 2012-09-30 MED ORDER — MIDAZOLAM HCL 5 MG/5ML IJ SOLN
INTRAMUSCULAR | Status: DC | PRN
  Administered 2012-09-30: 2 mg via INTRAVENOUS

## 2012-09-30 SURGICAL SUPPLY — 63 items
APL SKNCLS STERI-STRIP NONHPOA (GAUZE/BANDAGES/DRESSINGS) ×1
APPLIER CLIP ROT 10 11.4 M/L (STAPLE)
APR CLP MED LRG 11.4X10 (STAPLE)
BAG SPEC RTRVL LRG 6X4 10 (ENDOMECHANICALS) ×1
BENZOIN TINCTURE PRP APPL 2/3 (GAUZE/BANDAGES/DRESSINGS) ×2 IMPLANT
BLADE SURG SZ10 CARB STEEL (BLADE) ×2 IMPLANT
CANISTER SUCTION 2500CC (MISCELLANEOUS) ×2 IMPLANT
CHLORAPREP W/TINT 26ML (MISCELLANEOUS) ×2 IMPLANT
CLIP APPLIE ROT 10 11.4 M/L (STAPLE) IMPLANT
CLOTH BEACON ORANGE TIMEOUT ST (SAFETY) ×2 IMPLANT
CUTTER FLEX LINEAR 45M (STAPLE) ×2 IMPLANT
DECANTER SPIKE VIAL GLASS SM (MISCELLANEOUS) ×2 IMPLANT
DRAIN CHANNEL RND F F (WOUND CARE) ×2 IMPLANT
DRAPE LAPAROSCOPIC ABDOMINAL (DRAPES) ×2 IMPLANT
DRAPE UTILITY XL STRL (DRAPES) ×2 IMPLANT
DRAPE WARM FLUID 44X44 (DRAPE) ×2 IMPLANT
DRESSING SURGICEL FIBRLLR 1X2 (HEMOSTASIS) ×1 IMPLANT
DRSG OPSITE POSTOP 4X6 (GAUZE/BANDAGES/DRESSINGS) ×4 IMPLANT
DRSG SURGICEL FIBRILLAR 1X2 (HEMOSTASIS) ×2
DRSG TEGADERM 2-3/8X2-3/4 SM (GAUZE/BANDAGES/DRESSINGS) ×6 IMPLANT
DRSG TEGADERM 4X4.75 (GAUZE/BANDAGES/DRESSINGS) IMPLANT
ELECT CAUTERY BLADE 6.4 (BLADE) ×2 IMPLANT
ELECT REM PT RETURN 9FT ADLT (ELECTROSURGICAL) ×2
ELECTRODE REM PT RTRN 9FT ADLT (ELECTROSURGICAL) ×1 IMPLANT
ENDOLOOP SUT PDS II  0 18 (SUTURE)
ENDOLOOP SUT PDS II 0 18 (SUTURE) IMPLANT
EVACUATOR SILICONE 100CC (DRAIN) ×2 IMPLANT
GLOVE BIO SURGEON STRL SZ 6 (GLOVE) ×2 IMPLANT
GLOVE BIOGEL PI IND STRL 6.5 (GLOVE) ×1 IMPLANT
GLOVE BIOGEL PI IND STRL 7.0 (GLOVE) ×2 IMPLANT
GLOVE BIOGEL PI INDICATOR 6.5 (GLOVE) ×1
GLOVE BIOGEL PI INDICATOR 7.0 (GLOVE) ×2
GLOVE INDICATOR 6.5 STRL GRN (GLOVE) IMPLANT
GOWN PREVENTION PLUS XXLARGE (GOWN DISPOSABLE) ×2 IMPLANT
GOWN STRL REIN XL XLG (GOWN DISPOSABLE) ×6 IMPLANT
KIT BASIN OR (CUSTOM PROCEDURE TRAY) ×2 IMPLANT
NS IRRIG 1000ML POUR BTL (IV SOLUTION) ×2 IMPLANT
PENCIL BUTTON HOLSTER BLD 10FT (ELECTRODE) ×2 IMPLANT
POUCH SPECIMEN RETRIEVAL 10MM (ENDOMECHANICALS) ×2 IMPLANT
RELOAD 45 VASCULAR/THIN (ENDOMECHANICALS) ×2 IMPLANT
RELOAD STAPLE TA45 3.5 REG BLU (ENDOMECHANICALS) IMPLANT
SCALPEL HARMONIC ACE (MISCELLANEOUS) ×2 IMPLANT
SET IRRIG TUBING LAPAROSCOPIC (IRRIGATION / IRRIGATOR) ×2 IMPLANT
SOLUTION ANTI FOG 6CC (MISCELLANEOUS) ×2 IMPLANT
SPONGE DRAIN TRACH 4X4 STRL 2S (GAUZE/BANDAGES/DRESSINGS) ×2 IMPLANT
SPONGE LAP 18X18 X RAY DECT (DISPOSABLE) ×2 IMPLANT
STRIP CLOSURE SKIN 1/2X4 (GAUZE/BANDAGES/DRESSINGS) ×2 IMPLANT
SUT ETHILON 2 0 PS N (SUTURE) ×2 IMPLANT
SUT MNCRL AB 4-0 PS2 18 (SUTURE) ×2 IMPLANT
SUT PDS AB 1 CTX 36 (SUTURE) ×2 IMPLANT
SUT VIC AB 2-0 SH 27 (SUTURE) ×2
SUT VIC AB 2-0 SH 27X BRD (SUTURE) ×1 IMPLANT
SUT VICRYL 0 ENDOLOOP (SUTURE) IMPLANT
SUT VICRYL 0 UR6 27IN ABS (SUTURE) ×4 IMPLANT
SYR BULB IRRIGATION 50ML (SYRINGE) ×2 IMPLANT
TOWEL OR 17X26 10 PK STRL BLUE (TOWEL DISPOSABLE) ×2 IMPLANT
TRAY FOLEY CATH 14FRSI W/METER (CATHETERS) ×2 IMPLANT
TRAY LAP CHOLE (CUSTOM PROCEDURE TRAY) ×2 IMPLANT
TROCAR BLADELESS OPT 5 75 (ENDOMECHANICALS) ×6 IMPLANT
TROCAR XCEL BLUNT TIP 100MML (ENDOMECHANICALS) ×2 IMPLANT
TROCAR XCEL NON-BLD 11X100MML (ENDOMECHANICALS) IMPLANT
TUBING INSUFFLATION 10FT LAP (TUBING) ×2 IMPLANT
YANKAUER SUCT BULB TIP 10FT TU (MISCELLANEOUS) ×2 IMPLANT

## 2012-09-30 NOTE — Anesthesia Postprocedure Evaluation (Signed)
Anesthesia Post Note  Patient: Russell Cooper  Procedure(s) Performed: Procedure(s) (LRB): APPENDECTOMY LAPAROSCOPIC (N/A)  Anesthesia type: General  Patient location: PACU  Post pain: Pain level controlled  Post assessment: Post-op Vital signs reviewed  Last Vitals:  Filed Vitals:   09/30/12 1415  BP: 136/84  Pulse: 113  Temp: 37.8 C  Resp: 19    Post vital signs: Reviewed  Level of consciousness: sedated  Complications: No apparent anesthesia complications

## 2012-09-30 NOTE — Anesthesia Preprocedure Evaluation (Addendum)
Anesthesia Evaluation  Patient identified by MRN, date of birth, ID band Patient awake    Reviewed: Allergy & Precautions, H&P , NPO status , Patient's Chart, lab work & pertinent test results  Airway Mallampati: II TM Distance: >3 FB Neck ROM: Full    Dental  (+) Teeth Intact and Dental Advisory Given   Pulmonary Current Smoker,  breath sounds clear to auscultation  Pulmonary exam normal       Cardiovascular hypertension, negative cardio ROS  + Valvular Problems/Murmurs Rhythm:Regular Rate:Normal     Neuro/Psych Anxiety Depression negative neurological ROS  negative psych ROS   GI/Hepatic negative GI ROS, Neg liver ROS, GERD-  Medicated,  Endo/Other  negative endocrine ROS  Renal/GU negative Renal ROS  negative genitourinary   Musculoskeletal negative musculoskeletal ROS (+)   Abdominal   Peds negative pediatric ROS (+)  Hematology negative hematology ROS (+) HIV, Thrombocytopenia   Anesthesia Other Findings   Reproductive/Obstetrics negative OB ROS                          Anesthesia Physical Anesthesia Plan  ASA: III and emergent  Anesthesia Plan: General   Post-op Pain Management:    Induction: Intravenous, Rapid sequence and Cricoid pressure planned  Airway Management Planned: Oral ETT  Additional Equipment:   Intra-op Plan:   Post-operative Plan: Extubation in OR  Informed Consent: I have reviewed the patients History and Physical, chart, labs and discussed the procedure including the risks, benefits and alternatives for the proposed anesthesia with the patient or authorized representative who has indicated his/her understanding and acceptance.   Dental advisory given  Plan Discussed with: CRNA  Anesthesia Plan Comments:         Anesthesia Quick Evaluation

## 2012-09-30 NOTE — Progress Notes (Signed)
Patient ID: Russell Cooper, male   DOB: September 29, 1970, 42 y.o.   MRN: [id number]         [facility] for Infectious Disease    Date of Admission:  09/27/2012           Day 4 ciprofloxacin        Day 4 metronidazole  Principal Problem:   Fever Active Problems:   HIV INFECTION   GERD   Nausea & vomiting   Abdominal pain, bilateral lower quadrant   Diarrhea   Hyperglycemia   Hypokalemia   . atorvastatin  20 mg Oral Daily  . ciprofloxacin  400 mg Intravenous Q12H  . efavirenz-emtricitabine-tenofovir  1 tablet Oral QHS  . lip balm  1 application Topical BID  . metronidazole  500 mg Intravenous Q8H  . niacin  2,000 mg Oral QHS  . pantoprazole (PROTONIX) IV  40 mg Intravenous Q24H  . saccharomyces boulardii  250 mg Oral BID  . sodium chloride  3 mL Intravenous Q12H    Subjective: Russell Cooper is having more right lower quadrant pain overnight. Russell Cooper is now intermittently sharp and stabbing pain up to 7 on a scale of 1-10. When Russell Cooper has pain Russell Cooper tends to have some nausea and vomiting. His not having any more diarrhea.  Review of Systems: Pertinent items are noted in HPI.  Past Medical History  Diagnosis Date  . HIV (human immunodeficiency virus infection)   . Hypercholesteremia   . Undiagnosed cardiac murmurs     History  Substance Use Topics  . Smoking status: Current Every Day Smoker -- 0.50 packs/day    Types: Cigarettes  . Smokeless tobacco: Never Used  . Alcohol Use: Yes     Comment: Rarely drinks alcohol.  No recent drinking    History reviewed. No pertinent family history.  No Known Allergies  Objective: Temp:  [97.9 F (36.6 C)-100.3 F (37.9 C)] 98.6 F (37 C) (06/25 0522) Pulse Rate:  [93-99] 93 (06/25 0522) Resp:  [18-20] 18 (06/25 0522) BP: (127-141)/(83-86) 141/83 mmHg (06/25 0522) SpO2:  [90 %-93 %] 93 % (06/25 0522)  General: Russell Cooper appears uncomfortable Skin: No rash Lungs: Clear Cor: Regular S1 and S2 no murmurs Abdomen: Soft and quiet with right lower  quadrant discomfort. No rebound tenderness   Lab Results Lab Results  Component Value Date   WBC 13.6* 09/30/2012   HGB 12.7* 09/30/2012   HCT 37.8* 09/30/2012   MCV 94.3 09/30/2012   PLT 92* 09/30/2012    Lab Results  Component Value Date   CREATININE 0.79 09/30/2012   BUN 6 09/30/2012   NA 131* 09/30/2012   K 3.3* 09/30/2012   CL 99 09/30/2012   CO2 27 09/30/2012    Lab Results  Component Value Date   ALT 15 09/29/2012   AST 12 09/29/2012   ALKPHOS 41 09/29/2012   BILITOT 0.9 09/29/2012      HIV 1 RNA Quant (copies/mL)  Date Value  06/03/2012 21   12/04/2011 <20   04/08/2011 <20      CD4 T Cell Abs (cmm)  Date Value  06/03/2012 910   12/04/2011 780   04/08/2011 580    Microbiology: Recent Results (from the past 240 hour(s))  CULTURE, BLOOD (ROUTINE X 2)     Status: None   Collection Time    09/28/12  1:15 AM      Result Value Range Status   Specimen Description BLOOD RIGHT ARM   Final   Special Requests BOTTLES  DRAWN AEROBIC AND ANAEROBIC    Final   Culture  Setup Time 09/28/2012 09:30   Final   Culture     Final   Value:        BLOOD CULTURE RECEIVED NO GROWTH TO DATE CULTURE WILL BE HELD FOR 5 DAYS BEFORE ISSUING A FINAL NEGATIVE REPORT   Report Status PENDING   Incomplete  CULTURE, BLOOD (ROUTINE X 2)     Status: None   Collection Time    09/28/12  1:22 AM      Result Value Range Status   Specimen Description BLOOD RIGHT ARM   Final   Special Requests BOTTLES DRAWN AEROBIC AND ANAEROBIC    Final   Culture  Setup Time 09/28/2012 09:30   Final   Culture     Final   Value:        BLOOD CULTURE RECEIVED NO GROWTH TO DATE CULTURE WILL BE HELD FOR 5 DAYS BEFORE ISSUING A FINAL NEGATIVE REPORT   Report Status PENDING   Incomplete  CLOSTRIDIUM DIFFICILE BY PCR     Status: None   Collection Time    09/28/12  1:59 PM      Result Value Range Status   C difficile by pcr NEGATIVE  NEGATIVE Final    Studies/Results: Ct Abdomen Pelvis W Contrast  09/29/2012    *RADIOLOGY REPORT*  Clinical Data: Lower abdominal pain.  Evaluate for appendicitis or gastroenteritis.  Equivocal appendix on prior exam.HIV positive.  CT ABDOMEN AND PELVIS WITH CONTRAST  Technique:  Multidetector CT imaging of the abdomen and pelvis was performed following the standard protocol during bolus administration of intravenous contrast.  Contrast: OMNIPAQUE IOHEXOL 300 MG/ML  SOLN  Comparison: 09/27/2012  Findings: Lung bases:  Development of bibasilar collapse / consolidative change.  Slightly greater on the right than left.  Mild cardiomegaly with new trace bilateral pleural effusions.  Abdomen/pelvis:  Normal liver, spleen, stomach, pancreas, gallbladder, biliary tract, adrenal glands.  Too small to characterize right renal lesions which are likely cysts.  Normal left kidney.  Left-sided IVC. No retroperitoneal or retrocrural adenopathy.  Extensive colonic diverticulosis.  The colon is normal in caliber. Normal terminal ileum.  The appendix is dilated and thick-walled, including image 55/series 2.  There is intraluminal gas.  No extraluminal gas or periappendiceal fluid collection is seen.  Edema and peritoneal thickening are centered in the right lower quadrant, including on image 56/series 2.  Mild small bowel dilatation at up to 3.9 cm image 62/series 2.  No focal transition point identified.  Suspicion of mild distal ileal wall thickening, including image 75/series 2.  Tiny fat containing left inguinal hernia. No pelvic adenopathy. Normal urinary bladder and prostate.  Development of small volume cul-de-sac fluid, which appears simple.  Bones/Musculoskeletal:  No acute osseous abnormality.  IMPRESSION:  1.  Development of inflammation centered about the right lower quadrant.  Concurrent appendiceal wall thickening and dilatation with intraluminal gas.  Suggestion of synchronous adjacent ileal wall thickening.  Considerations include an atypical appearance of appendicitis (with intraluminal  gas) or infectious enteritis with secondary appendiceal inflammation. Case discussed with Dr. Donell Beers at 2 p.m. 2.  Small volume cul-de-sac fluid which is new and presumably related to the right lower quadrant inflammatory process. 3.  Development of collapse / consolidative change at the lung bases.  Infection versus atelectasis.  Concurrent new small bilateral pleural effusions. 4.  Left-sided IVC.   Original Report Authenticated By: Jeronimo Greaves, M.D.  Assessment: Worrisome for acute appendicitis.  Plan: 1. Continue current antibiotics 2. Surgery is planned for later today  Cliffton Asters, MD [facility] for Infectious Disease [facility] [phone] pager   [phone] cell 09/30/2012, 9:46 AM

## 2012-09-30 NOTE — Preoperative (Signed)
Beta Blockers   Reason not to administer Beta Blockers:Not Applicable 

## 2012-09-30 NOTE — Progress Notes (Signed)
Patient ID: Russell Cooper, male   DOB: 11/03/1970, 42 y.o.   MRN: [id number] Day of Surgery  Subjective: CT scan yesterday still equivocal and pt yesterday felt improved with no RUQ tenderness.  However, he had bad night last night, needing increased pain medication and concentration of pain in RUQ.  +N/V also  Objective: Vital signs in last 24 hours: Temp:  [97.9 F (36.6 C)-100.3 F (37.9 C)] 98.6 F (37 C) (06/25 0522) Pulse Rate:  [93-99] 93 (06/25 0522) Resp:  [18-20] 18 (06/25 0522) BP: (127-141)/(83-86) 141/83 mmHg (06/25 0522) SpO2:  [90 %-93 %] 93 % (06/25 0522) Last BM Date: 09/28/12  Intake/Output from previous day: 06/24 0701 - 06/25 0700 In: 3100 [I.V.:1950; IV Piggyback:1150] Out: 350 [Urine:350] Intake/Output this shift:    PE: Abd: soft, RUQ tenderness. Ht: regular Lungs: CTAB  Lab Results:   Recent Labs  09/29/12 0515 09/30/12 0500  WBC 14.0* 13.6*  HGB 12.1* 12.7*  HCT 36.2* 37.8*  PLT 78* 92*   BMET  Recent Labs  09/29/12 0515 09/30/12 0500  NA 133* 131*  K 3.3* 3.3*  CL 100 99  CO2 26 27  GLUCOSE 154* 142*  BUN 6 6  CREATININE 0.85 0.79  CALCIUM 7.9* 8.1*   PT/INR  Recent Labs  09/28/12 0122  LABPROT 14.1  INR 1.10   CMP     Component Value Date/Time   NA 131* 09/30/2012 0500   K 3.3* 09/30/2012 0500   CL 99 09/30/2012 0500   CO2 27 09/30/2012 0500   GLUCOSE 142* 09/30/2012 0500   BUN 6 09/30/2012 0500   CREATININE 0.79 09/30/2012 0500   CREATININE 0.91 06/03/2012 0951   CALCIUM 8.1* 09/30/2012 0500   PROT 5.9* 09/29/2012 0515   ALBUMIN 2.7* 09/29/2012 0515   AST 12 09/29/2012 0515   ALT 15 09/29/2012 0515   ALKPHOS 41 09/29/2012 0515   BILITOT 0.9 09/29/2012 0515   GFRNONAA >90 09/30/2012 0500   GFRAA >90 09/30/2012 0500   Lipase     Component Value Date/Time   LIPASE 15 09/27/2012 1715       Studies/Results: Ct Abdomen Pelvis W Contrast  09/29/2012   *RADIOLOGY REPORT*  Clinical Data: Lower abdominal pain.  Evaluate  for appendicitis or gastroenteritis.  Equivocal appendix on prior exam.HIV positive.  CT ABDOMEN AND PELVIS WITH CONTRAST  Technique:  Multidetector CT imaging of the abdomen and pelvis was performed following the standard protocol during bolus administration of intravenous contrast.  Contrast: OMNIPAQUE IOHEXOL 300 MG/ML  SOLN  Comparison: 09/27/2012  Findings: Lung bases:  Development of bibasilar collapse / consolidative change.  Slightly greater on the right than left.  Mild cardiomegaly with new trace bilateral pleural effusions.  Abdomen/pelvis:  Normal liver, spleen, stomach, pancreas, gallbladder, biliary tract, adrenal glands.  Too small to characterize right renal lesions which are likely cysts.  Normal left kidney.  Left-sided IVC. No retroperitoneal or retrocrural adenopathy.  Extensive colonic diverticulosis.  The colon is normal in caliber. Normal terminal ileum.  The appendix is dilated and thick-walled, including image 55/series 2.  There is intraluminal gas.  No extraluminal gas or periappendiceal fluid collection is seen.  Edema and peritoneal thickening are centered in the right lower quadrant, including on image 56/series 2.  Mild small bowel dilatation at up to 3.9 cm image 62/series 2.  No focal transition point identified.  Suspicion of mild distal ileal wall thickening, including image 75/series 2.  Tiny fat containing left inguinal hernia. No  pelvic adenopathy. Normal urinary bladder and prostate.  Development of small volume cul-de-sac fluid, which appears simple.  Bones/Musculoskeletal:  No acute osseous abnormality.  IMPRESSION:  1.  Development of inflammation centered about the right lower quadrant.  Concurrent appendiceal wall thickening and dilatation with intraluminal gas.  Suggestion of synchronous adjacent ileal wall thickening.  Considerations include an atypical appearance of appendicitis (with intraluminal gas) or infectious enteritis with secondary appendiceal  inflammation. Case discussed with Dr. Donell Beers at 2 p.m. 2.  Small volume cul-de-sac fluid which is new and presumably related to the right lower quadrant inflammatory process. 3.  Development of collapse / consolidative change at the lung bases.  Infection versus atelectasis.  Concurrent new small bilateral pleural effusions. 4.  Left-sided IVC.   Original Report Authenticated By: Jeronimo Greaves, M.D.    Anti-infectives: Anti-infectives   Start     Dose/Rate Route Frequency Ordered Stop   09/28/12 0900  ciprofloxacin (CIPRO) IVPB 400 mg     400 mg 200 mL/hr over 60 Minutes Intravenous Every 12 hours 09/28/12 0742     09/28/12 0800  metroNIDAZOLE (FLAGYL) IVPB 500 mg     500 mg 100 mL/hr over 60 Minutes Intravenous Every 8 hours 09/28/12 0742     09/27/12 2300  efavirenz-emtricitabine-tenofovir (ATRIPLA) 600-200-300 MG per tablet 1 tablet     1 tablet Oral Daily at bedtime 09/27/12 2210         Assessment/Plan  1. Abdominal pain, appendicitis vs gastroenteritis 2. HIV 3. Thrombocytopenia   Plan: Given increased pain and medication requirement, will do diagnostic laparoscopy today.  Will plan removal of appendix barring unforseen circumstances.      LOS: 3 days    Artemisia Auvil 09/30/2012, 10:13 AM Pager: [phone]

## 2012-09-30 NOTE — Transfer of Care (Signed)
Immediate Anesthesia Transfer of Care Note  Patient: Russell Cooper  Procedure(s) Performed: Procedure(s) (LRB): APPENDECTOMY LAPAROSCOPIC (N/A)  Patient Location: PACU  Anesthesia Type: General  Level of Consciousness: sedated, patient cooperative and responds to stimulaton  Airway & Oxygen Therapy: Patient Spontanous Breathing and Patient connected to face mask oxgen  Post-op Assessment: Report given to PACU RN and Post -op Vital signs reviewed and stable  Post vital signs: Reviewed and stable  Complications: No apparent anesthesia complications

## 2012-09-30 NOTE — Op Note (Signed)
PRE-OPERATIVE DIAGNOSIS: acute gastroenteritis and appendicitis  POST-OPERATIVE DIAGNOSIS:  Same  PROCEDURE:  Procedure(s): Laparoscopic assisted appendectomy with open right upper quadrant incision to close cecal defect.  SURGEON:  Surgeon(s): Almond Lint, MD  ASSIST Barnetta Chapel, PA-C  ANESTHESIA:   general  DRAINS: (19 Fr) Blake drain(s) in the RUQ   LOCAL MEDICATIONS USED:  MARCAINE    and LIDOCAINE   SPECIMEN:  Source of Specimen:  appendix  DISPOSITION OF SPECIMEN:  PATHOLOGY  COUNTS:  YES  DICTATION: .Dragon Dictation  PLAN OF CARE: Admit to inpatient   PATIENT DISPOSITION:  PACU - hemodynamically stable.  EBL <43mL  FINDINGS:   Diffusely inflamed bowel. Acute gangrenous appendicitis.  No abscess, but supporative fluid in RUQ.    PROCEDURE:    Patient was identified in the holding area taken to the PACU where he was placed on the operating room table. General anesthesia was induced. Arms were tucked. A Foley catheter was placed.  His abdomen was then prepped and draped in sterile fashion. Timeout was performed according to the surgical safety checklist. When all was correct, we continued.  The infraumbilical skin with a stat local anesthetic and a curvilinear transverse incision was made with a #11 blade. The subcutaneous tissues were divided with a Kelly clamp. 2 Kochers were used to elevate the midline fascia. This was incised in the midline with a #11 blade. The 0 Vicryl pursestring suture was placed around the fascial incision.  The [facility] trocar was advanced through the incision and held in place to the abdominal wall with the tails of the suture. Pneumoperitoneum was achieved to a pressure of 15 mm mercury.  The cecum was visible in the right upper quadrant. Two 5 mm ports were placed in the midline above the umbilicus.  The bowel is diffusely inflamed. The bowel was also dilated.  The appendix was located in a retrocecal location.  The appendiceal tip was  located. There was supporative fluid adjacent to it.  Because of the dilated bowel and the posterolateral location of the appendix, an additional 5 mm port was placed in the RLQ.  The appendix was dissected away from the surrounding tissues. The harmonic was used to assist with taking down the mesoappendix. In trying to mobilize the base of the cecum, the appendix pulled away from the cecum.  The appendix was removed in an Endocatch bag via the umbilical port.  The cecum could not be mobilized without pulling too much on the adjacent inflamed bowel, so a small incision was made over the appendiceal stump.  The cecum was oversewn with a 0-0 Vicryl x 2.  The defect was then oversewn with a 2-0 vicryl to reinforce the closure.  A 19 Fr Blake drain was placed through the RLQ port.    The right lateral incision was closed with 0 vicryl on the peritoneum.  The muscular layers were closed with #1 PDS.  Both layers were closed individually.  The abdomen was reinsufflated and examined for hemostasis with the laparoscope.  The closure appeared good, and there was good hemostasis.  The abdomen was irrigated.  The wounds were closed with 4-0 monocryl in subcuticular fashion.    Needle, sponge, and instrument counts were correct x 2.    Pt was extubated and taken to pacu in stable condition.

## 2012-09-30 NOTE — Progress Notes (Signed)
[facility] PROGRESS NOTE  Russell Cooper NWG:[id number] DOB: 07/15/1970 DOA: 09/27/2012 PCP: Acey Lav, MD  Assessment/Plan:  1. Query appendicitis:  - General surgery on board - Will await disposition recommendations from surgery and ID. - Patient is s/p day 0 appendectomy  2. Fever:  -Most likely due to appendicitis. -,No fevers in the last 24 hours  3. HIV Infection:  - Per ID - will follow up with their recommendations.  4. GERD: - Stable on IV Protonix  5. Dyslipidemia:  - Once taking po would plan on continuing lipitor  Code Status: full Family Communication: discussed with patient at bedside.  Disposition Plan: Pending further evaluation and recommendations   Consultants:  ID. Dr. Orvan Falconer  General surgery Dr. Donell Beers  Procedures:  S/p day 0 appendectomy  Antibiotics:  On Zosyn  Antivirals Atripla  HPI/Subjective: Patient has no new complaints at this moment.  Objective: Filed Vitals:   09/30/12 1400 09/30/12 1415 09/30/12 1440 09/30/12 1545  BP: 146/85 136/84 145/93 137/82  Pulse: 88 113 101 106  Temp:  100.1 F (37.8 C) 98.3 F (36.8 C) 98.6 F (37 C)  TempSrc:    Oral  Resp: 15 19 18 16   Height:      Weight:      SpO2: 99% 96% 94% 95%    Intake/Output Summary (Last 24 hours) at 09/30/12 1708 Last data filed at 09/30/12 1540  Gross per 24 hour  Intake 3869.58 ml  Output    540 ml  Net 3329.58 ml   Filed Weights   09/27/12 1605 09/27/12 2347  Weight: 86.183 kg (190 lb) 86.8 kg (191 lb 5.8 oz)    Exam:   General:  Pt in NAD, Alert and Awake  Cardiovascular: RRR, no MRG  Respiratory: CTA BL, no wheezes  Abdomen: soft, guaze in place, no active bleeding  Musculoskeletal: no cyanosis or clubbing   Data Reviewed: Basic Metabolic Panel:  Recent Labs Lab 09/27/12 1715 09/28/12 0122 09/29/12 0515 09/30/12 0500  NA 138 134* 133* 131*  K 3.3* 3.2* 3.3* 3.3*  CL 99 99 100 99  CO2 25 26 26 27   GLUCOSE  123* 142* 154* 142*  BUN 8 8 6 6   CREATININE 0.70 0.82 0.85 0.79  CALCIUM 9.3 8.4 7.9* 8.1*   Liver Function Tests:  Recent Labs Lab 09/27/12 1715 09/29/12 0515  AST 21 12  ALT 22 15  ALKPHOS 61 41  BILITOT 0.8 0.9  PROT 7.8 5.9*  ALBUMIN 4.5 2.7*    Recent Labs Lab 09/27/12 1715  LIPASE 15   No results found for this basename: AMMONIA,  in the last 168 hours CBC:  Recent Labs Lab 09/27/12 1715 09/28/12 0122 09/29/12 0515 09/30/12 0500  WBC 14.7* 15.6* 14.0* 13.6*  NEUTROABS 13.2*  --   --   --   HGB 15.1 12.7* 12.1* 12.7*  HCT 42.3 37.2* 36.2* 37.8*  MCV 94.6 93.7 94.5 94.3  PLT 94* 86* 78* 92*   Cardiac Enzymes: No results found for this basename: CKTOTAL, CKMB, CKMBINDEX, TROPONINI,  in the last 168 hours BNP (last 3 results) No results found for this basename: PROBNP,  in the last 8760 hours CBG:  Recent Labs Lab 09/30/12 0726  GLUCAP 135*    Recent Results (from the past 240 hour(s))  CULTURE, BLOOD (ROUTINE X 2)     Status: None   Collection Time    09/28/12  1:15 AM      Result Value Range Status  Specimen Description BLOOD RIGHT ARM   Final   Special Requests BOTTLES DRAWN AEROBIC AND ANAEROBIC    Final   Culture  Setup Time 09/28/2012 09:30   Final   Culture     Final   Value:        BLOOD CULTURE RECEIVED NO GROWTH TO DATE CULTURE WILL BE HELD FOR 5 DAYS BEFORE ISSUING A FINAL NEGATIVE REPORT   Report Status PENDING   Incomplete  CULTURE, BLOOD (ROUTINE X 2)     Status: None   Collection Time    09/28/12  1:22 AM      Result Value Range Status   Specimen Description BLOOD RIGHT ARM   Final   Special Requests BOTTLES DRAWN AEROBIC AND ANAEROBIC    Final   Culture  Setup Time 09/28/2012 09:30   Final   Culture     Final   Value:        BLOOD CULTURE RECEIVED NO GROWTH TO DATE CULTURE WILL BE HELD FOR 5 DAYS BEFORE ISSUING A FINAL NEGATIVE REPORT   Report Status PENDING   Incomplete  CLOSTRIDIUM DIFFICILE BY PCR     Status: None    Collection Time    09/28/12  1:59 PM      Result Value Range Status   C difficile by pcr NEGATIVE  NEGATIVE Final  SURGICAL PCR SCREEN     Status: None   Collection Time    09/30/12 10:00 AM      Result Value Range Status   MRSA, PCR NEGATIVE  NEGATIVE Final   Staphylococcus aureus NEGATIVE  NEGATIVE Final   Comment:            The Xpert SA Assay (FDA     approved for NASAL specimens     in patients over 30 years of age),     is one component of     a comprehensive surveillance     program.  Test performance has     been validated by [employer] for patients greater     than or equal to 57 year old.     It is not intended     to diagnose infection nor to     guide or monitor treatment.     Studies: Ct Abdomen Pelvis W Contrast  09/29/2012   *RADIOLOGY REPORT*  Clinical Data: Lower abdominal pain.  Evaluate for appendicitis or gastroenteritis.  Equivocal appendix on prior exam.HIV positive.  CT ABDOMEN AND PELVIS WITH CONTRAST  Technique:  Multidetector CT imaging of the abdomen and pelvis was performed following the standard protocol during bolus administration of intravenous contrast.  Contrast: OMNIPAQUE IOHEXOL 300 MG/ML  SOLN  Comparison: 09/27/2012  Findings: Lung bases:  Development of bibasilar collapse / consolidative change.  Slightly greater on the right than left.  Mild cardiomegaly with new trace bilateral pleural effusions.  Abdomen/pelvis:  Normal liver, spleen, stomach, pancreas, gallbladder, biliary tract, adrenal glands.  Too small to characterize right renal lesions which are likely cysts.  Normal left kidney.  Left-sided IVC. No retroperitoneal or retrocrural adenopathy.  Extensive colonic diverticulosis.  The colon is normal in caliber. Normal terminal ileum.  The appendix is dilated and thick-walled, including image 55/series 2.  There is intraluminal gas.  No extraluminal gas or periappendiceal fluid collection is seen.  Edema and peritoneal thickening  are centered in the right lower quadrant, including on image 56/series 2.  Mild small bowel dilatation at  up to 3.9 cm image 62/series 2.  No focal transition point identified.  Suspicion of mild distal ileal wall thickening, including image 75/series 2.  Tiny fat containing left inguinal hernia. No pelvic adenopathy. Normal urinary bladder and prostate.  Development of small volume cul-de-sac fluid, which appears simple.  Bones/Musculoskeletal:  No acute osseous abnormality.  IMPRESSION:  1.  Development of inflammation centered about the right lower quadrant.  Concurrent appendiceal wall thickening and dilatation with intraluminal gas.  Suggestion of synchronous adjacent ileal wall thickening.  Considerations include an atypical appearance of appendicitis (with intraluminal gas) or infectious enteritis with secondary appendiceal inflammation. Case discussed with Dr. Donell Beers at 2 p.m. 2.  Small volume cul-de-sac fluid which is new and presumably related to the right lower quadrant inflammatory process. 3.  Development of collapse / consolidative change at the lung bases.  Infection versus atelectasis.  Concurrent new small bilateral pleural effusions. 4.  Left-sided IVC.   Original Report Authenticated By: Jeronimo Greaves, M.D.    Scheduled Meds: . atorvastatin  20 mg Oral Daily  . efavirenz-emtricitabine-tenofovir  1 tablet Oral QHS  . lip balm  1 application Topical BID  . niacin  2,000 mg Oral QHS  . pantoprazole (PROTONIX) IV  40 mg Intravenous Q24H  . piperacillin-tazobactam (ZOSYN)  IV  3.375 g Intravenous Q8H  . saccharomyces boulardii  250 mg Oral BID  . sodium chloride  3 mL Intravenous Q12H   Continuous Infusions: . dextrose 5 % and 0.9% NaCl 125 mL/hr at 09/30/12 1541    Principal Problem:   Fever Active Problems:   HIV INFECTION   GERD   Nausea & vomiting   Abdominal pain, bilateral lower quadrant   Diarrhea   Hyperglycemia   Hypokalemia    Time spent: > 35 minutes    [name]  [facility] Pager [phone]. If 7PM-7AM, please contact night-coverage at [email or web address], password [facility] 09/30/2012, 5:08 PM  LOS: 3 days

## 2012-10-01 ENCOUNTER — Encounter (HOSPITAL_COMMUNITY): Payer: Self-pay | Admitting: General Surgery

## 2012-10-01 DIAGNOSIS — Z21 Asymptomatic human immunodeficiency virus [HIV] infection status: Secondary | ICD-10-CM

## 2012-10-01 DIAGNOSIS — F329 Major depressive disorder, single episode, unspecified: Secondary | ICD-10-CM

## 2012-10-01 LAB — CBC WITH DIFFERENTIAL/PLATELET
Basophils Absolute: 0 10*3/uL (ref 0.0–0.1)
Eosinophils Relative: 0 % (ref 0–5)
Lymphocytes Relative: 7 % — ABNORMAL LOW (ref 12–46)
Lymphs Abs: 0.8 10*3/uL (ref 0.7–4.0)
Neutro Abs: 9.1 10*3/uL — ABNORMAL HIGH (ref 1.7–7.7)
Neutrophils Relative %: 83 % — ABNORMAL HIGH (ref 43–77)
Platelets: 103 10*3/uL — ABNORMAL LOW (ref 150–400)
RBC: 3.99 MIL/uL — ABNORMAL LOW (ref 4.22–5.81)
RDW: 12.8 % (ref 11.5–15.5)
WBC: 11 10*3/uL — ABNORMAL HIGH (ref 4.0–10.5)

## 2012-10-01 LAB — BASIC METABOLIC PANEL
CO2: 28 mEq/L (ref 19–32)
Calcium: 7.7 mg/dL — ABNORMAL LOW (ref 8.4–10.5)
Chloride: 96 mEq/L (ref 96–112)
Glucose, Bld: 142 mg/dL — ABNORMAL HIGH (ref 70–99)
Potassium: 3.3 mEq/L — ABNORMAL LOW (ref 3.5–5.1)
Sodium: 132 mEq/L — ABNORMAL LOW (ref 135–145)

## 2012-10-01 LAB — URINE CULTURE: Colony Count: NO GROWTH

## 2012-10-01 MED ORDER — POTASSIUM CHLORIDE CRYS ER 20 MEQ PO TBCR
20.0000 meq | EXTENDED_RELEASE_TABLET | Freq: Once | ORAL | Status: AC
  Administered 2012-10-01: 20 meq via ORAL
  Filled 2012-10-01: qty 1

## 2012-10-01 MED ORDER — ACETAMINOPHEN 325 MG PO TABS
325.0000 mg | ORAL_TABLET | ORAL | Status: AC
  Administered 2012-10-01: 325 mg via ORAL
  Filled 2012-10-01: qty 1

## 2012-10-01 NOTE — Progress Notes (Signed)
[facility] PROGRESS NOTE  Russell Cooper:[id number] DOB: [birth year] DOA: 09/27/2012 PCP: Acey Lav, MD  Assessment/Plan:  1. Query appendicitis:  - General surgery on board - Will await disposition recommendations from surgery and ID. - Patient is s/p day 1 appendectomy  2. Fever:  -Most likely due to appendicitis. - Continue with current antibiotic and antiretroviral medication.  3. HIV Infection:  - Per ID - will follow up with their recommendations.  4. GERD: - Stable on IV Protonix  5. Dyslipidemia:  - Place on lipitor with improved po intake.  Code Status: full Family Communication: no family at bedside.  Disposition Plan: Pending further evaluation and recommendations   Consultants:  ID. Dr. Orvan Falconer  General surgery Dr. Donell Beers  Procedures:  S/p day 0 appendectomy  Antibiotics:  On Zosyn  Antivirals Atripla  HPI/Subjective: Patient has no new complaints at this moment.  Objective: Filed Vitals:   09/30/12 2146 10/01/12 0106 10/01/12 0530 10/01/12 1445  BP: 145/82  124/83 139/80  Pulse: 113  95 92  Temp: 101.6 F (38.7 C) 99.8 F (37.7 C) 98.5 F (36.9 C) 99.2 F (37.3 C)  TempSrc: Oral Oral Oral Oral  Resp: 16  16 18   Height:      Weight:      SpO2: 94%  90% 94%    Intake/Output Summary (Last 24 hours) at 10/01/12 1909 Last data filed at 10/01/12 1833  Gross per 24 hour  Intake 2451.25 ml  Output   1810 ml  Net 641.25 ml   Filed Weights   09/27/12 1605 09/27/12 2347  Weight: 86.183 kg (190 lb) 86.8 kg (191 lb 5.8 oz)    Exam:   General:  Pt in NAD, Alert and Awake  Cardiovascular: RRR, no MRG  Respiratory: CTA BL, no wheezes  Abdomen: soft, guaze in place, no active bleeding  Musculoskeletal: no cyanosis or clubbing   Data Reviewed: Basic Metabolic Panel:  Recent Labs Lab 09/27/12 1715 09/28/12 0122 09/29/12 0515 09/30/12 0500 10/01/12 0106  NA 138 134* 133* 131* 132*  K 3.3* 3.2* 3.3*  3.3* 3.3*  CL 99 99 100 99 96  CO2 25 26 26 27 28   GLUCOSE 123* 142* 154* 142* 142*  BUN 8 8 6 6  5*  CREATININE 0.70 0.82 0.85 0.79 0.81  CALCIUM 9.3 8.4 7.9* 8.1* 7.7*   Liver Function Tests:  Recent Labs Lab 09/27/12 1715 09/29/12 0515  AST 21 12  ALT 22 15  ALKPHOS 61 41  BILITOT 0.8 0.9  PROT 7.8 5.9*  ALBUMIN 4.5 2.7*    Recent Labs Lab 09/27/12 1715  LIPASE 15   No results found for this basename: AMMONIA,  in the last 168 hours CBC:  Recent Labs Lab 09/27/12 1715 09/28/12 0122 09/29/12 0515 09/30/12 0500 10/01/12 0106  WBC 14.7* 15.6* 14.0* 13.6* 11.0*  NEUTROABS 13.2*  --   --   --  9.1*  HGB 15.1 12.7* 12.1* 12.7* 12.8*  HCT 42.3 37.2* 36.2* 37.8* 37.6*  MCV 94.6 93.7 94.5 94.3 94.2  PLT 94* 86* 78* 92* 103*   Cardiac Enzymes: No results found for this basename: CKTOTAL, CKMB, CKMBINDEX, TROPONINI,  in the last 168 hours BNP (last 3 results) No results found for this basename: PROBNP,  in the last 8760 hours CBG:  Recent Labs Lab 09/30/12 0726  GLUCAP 135*    Recent Results (from the past 240 hour(s))  CULTURE, BLOOD (ROUTINE X 2)     Status: None  Collection Time    09/28/12  1:15 AM      Result Value Range Status   Specimen Description BLOOD RIGHT ARM   Final   Special Requests BOTTLES DRAWN AEROBIC AND ANAEROBIC    Final   Culture  Setup Time 09/28/2012 09:30   Final   Culture     Final   Value:        BLOOD CULTURE RECEIVED NO GROWTH TO DATE CULTURE WILL BE HELD FOR 5 DAYS BEFORE ISSUING A FINAL NEGATIVE REPORT   Report Status PENDING   Incomplete  CULTURE, BLOOD (ROUTINE X 2)     Status: None   Collection Time    09/28/12  1:22 AM      Result Value Range Status   Specimen Description BLOOD RIGHT ARM   Final   Special Requests BOTTLES DRAWN AEROBIC AND ANAEROBIC    Final   Culture  Setup Time 09/28/2012 09:30   Final   Culture     Final   Value:        BLOOD CULTURE RECEIVED NO GROWTH TO DATE CULTURE WILL BE HELD FOR 5  DAYS BEFORE ISSUING A FINAL NEGATIVE REPORT   Report Status PENDING   Incomplete  CLOSTRIDIUM DIFFICILE BY PCR     Status: None   Collection Time    09/28/12  1:59 PM      Result Value Range Status   C difficile by pcr NEGATIVE  NEGATIVE Final  SURGICAL PCR SCREEN     Status: None   Collection Time    09/30/12 10:00 AM      Result Value Range Status   MRSA, PCR NEGATIVE  NEGATIVE Final   Staphylococcus aureus NEGATIVE  NEGATIVE Final   Comment:            The Xpert SA Assay (FDA     approved for NASAL specimens     in patients over 73 years of age),     is one component of     a comprehensive surveillance     program.  Test performance has     been validated by [employer] for patients greater     than or equal to 62 year old.     It is not intended     to diagnose infection nor to     guide or monitor treatment.     Studies: No results found.  Scheduled Meds: . atorvastatin  20 mg Oral Daily  . efavirenz-emtricitabine-tenofovir  1 tablet Oral QHS  . lip balm  1 application Topical BID  . niacin  2,000 mg Oral QHS  . pantoprazole (PROTONIX) IV  40 mg Intravenous Q24H  . piperacillin-tazobactam (ZOSYN)  IV  3.375 g Intravenous Q8H  . saccharomyces boulardii  250 mg Oral BID  . sodium chloride  3 mL Intravenous Q12H   Continuous Infusions: . sodium chloride 100 mL/hr at 10/01/12 0950    Principal Problem:   Fever Active Problems:   HIV INFECTION   GERD   Nausea & vomiting   Abdominal pain, bilateral lower quadrant   Diarrhea   Hyperglycemia   Hypokalemia    Time spent: > 35 minutes    [name]  [facility] Pager [phone]. If 7PM-7AM, please contact night-coverage at [email or web address], password [facility] 10/01/2012, 7:09 PM  LOS: 4 days

## 2012-10-01 NOTE — Progress Notes (Signed)
[facility]  Date of Admission:  09/27/2012  Antibiotics: Zosyn day 1 cipro flagyl 4 days  Subjective: No complaints  Objective: Temp:  [98.5 F (36.9 C)-101.7 F (38.7 C)] 99.2 F (37.3 C) (06/26 1445) Pulse Rate:  [92-113] 92 (06/26 1445) Resp:  [16-18] 18 (06/26 1445) BP: (124-145)/(80-85) 139/80 mmHg (06/26 1445) SpO2:  [90 %-98 %] 94 % (06/26 1445)  General: Awake, alert, nad Skin: no rashes Lungs: CTA B Cor: RRR   Lab Results Lab Results  Component Value Date   WBC 11.0* 10/01/2012   HGB 12.8* 10/01/2012   HCT 37.6* 10/01/2012   MCV 94.2 10/01/2012   PLT 103* 10/01/2012    Lab Results  Component Value Date   CREATININE 0.81 10/01/2012   BUN 5* 10/01/2012   NA 132* 10/01/2012   K 3.3* 10/01/2012   CL 96 10/01/2012   CO2 28 10/01/2012    Lab Results  Component Value Date   ALT 15 09/29/2012   AST 12 09/29/2012   ALKPHOS 41 09/29/2012   BILITOT 0.9 09/29/2012      Microbiology: Recent Results (from the past 240 hour(s))  CULTURE, BLOOD (ROUTINE X 2)     Status: None   Collection Time    09/28/12  1:15 AM      Result Value Range Status   Specimen Description BLOOD RIGHT ARM   Final   Special Requests BOTTLES DRAWN AEROBIC AND ANAEROBIC    Final   Culture  Setup Time 09/28/2012 09:30   Final   Culture     Final   Value:        BLOOD CULTURE RECEIVED NO GROWTH TO DATE CULTURE WILL BE HELD FOR 5 DAYS BEFORE ISSUING A FINAL NEGATIVE REPORT   Report Status PENDING   Incomplete  CULTURE, BLOOD (ROUTINE X 2)     Status: None   Collection Time    09/28/12  1:22 AM      Result Value Range Status   Specimen Description BLOOD RIGHT ARM   Final   Special Requests BOTTLES DRAWN AEROBIC AND ANAEROBIC    Final   Culture  Setup Time 09/28/2012 09:30   Final   Culture     Final   Value:        BLOOD CULTURE RECEIVED NO GROWTH TO DATE CULTURE WILL BE HELD FOR 5 DAYS BEFORE ISSUING A FINAL NEGATIVE REPORT   Report Status PENDING    Incomplete  CLOSTRIDIUM DIFFICILE BY PCR     Status: None   Collection Time    09/28/12  1:59 PM      Result Value Range Status   C difficile by pcr NEGATIVE  NEGATIVE Final  SURGICAL PCR SCREEN     Status: None   Collection Time    09/30/12 10:00 AM      Result Value Range Status   MRSA, PCR NEGATIVE  NEGATIVE Final   Staphylococcus aureus NEGATIVE  NEGATIVE Final   Comment:            The Xpert SA Assay (FDA     approved for NASAL specimens     in patients over 29 years of age),     is one component of     a comprehensive surveillance     program.  Test performance has     been validated by [employer] for patients greater     than or equal to 1  year old.     It is not intended     to diagnose infection nor to     guide or monitor treatment.    Studies/Results: No results found.  Assessment/Plan: 1) GI - stable, some enteritis, appendicitis.  On Zosyn.  2) HIV - getting Westley Gambles, MD [facility] [email or web address] [phone] pager   [phone] cell 10/01/2012, 4:18 PM

## 2012-10-01 NOTE — Progress Notes (Signed)
Expected POD 1 ileus. Seen, agree with above.

## 2012-10-01 NOTE — Progress Notes (Signed)
Patient ID: Russell Cooper, male   DOB: Aug 23, 1970, 42 y.o.   MRN: [id number] 1 Day Post-Op  Subjective: Sore but feels ok.  Tolerating clear liquids.  No flatus yet  Objective: Vital signs in last 24 hours: Temp:  [98.3 F (36.8 C)-101.7 F (38.7 C)] 98.5 F (36.9 C) (06/26 0530) Pulse Rate:  [88-113] 95 (06/26 0530) Resp:  [15-23] 16 (06/26 0530) BP: (124-153)/(82-109) 124/83 mmHg (06/26 0530) SpO2:  [90 %-99 %] 90 % (06/26 0530) Last BM Date: 09/28/12  Intake/Output from previous day: 06/25 0701 - 06/26 0700 In: 3685.4 [I.V.:3585.4; IV Piggyback:100] Out: 1380 [Urine:1250; Drains:130] Intake/Output this shift:    PE: Abd: soft, tender appropriately, +BS, mild distention, incisions c/d/i  Lab Results:   Recent Labs  09/30/12 0500 10/01/12 0106  WBC 13.6* 11.0*  HGB 12.7* 12.8*  HCT 37.8* 37.6*  PLT 92* 103*   BMET  Recent Labs  09/30/12 0500 10/01/12 0106  NA 131* 132*  K 3.3* 3.3*  CL 99 96  CO2 27 28  GLUCOSE 142* 142*  BUN 6 5*  CREATININE 0.79 0.81  CALCIUM 8.1* 7.7*   PT/INR No results found for this basename: LABPROT, INR,  in the last 72 hours CMP     Component Value Date/Time   NA 132* 10/01/2012 0106   K 3.3* 10/01/2012 0106   CL 96 10/01/2012 0106   CO2 28 10/01/2012 0106   GLUCOSE 142* 10/01/2012 0106   BUN 5* 10/01/2012 0106   CREATININE 0.81 10/01/2012 0106   CREATININE 0.91 06/03/2012 0951   CALCIUM 7.7* 10/01/2012 0106   PROT 5.9* 09/29/2012 0515   ALBUMIN 2.7* 09/29/2012 0515   AST 12 09/29/2012 0515   ALT 15 09/29/2012 0515   ALKPHOS 41 09/29/2012 0515   BILITOT 0.9 09/29/2012 0515   GFRNONAA >90 10/01/2012 0106   GFRAA >90 10/01/2012 0106   Lipase     Component Value Date/Time   LIPASE 15 09/27/2012 1715       Studies/Results: Ct Abdomen Pelvis W Contrast  09/29/2012   *RADIOLOGY REPORT*  Clinical Data: Lower abdominal pain.  Evaluate for appendicitis or gastroenteritis.  Equivocal appendix on prior exam.HIV positive.  CT  ABDOMEN AND PELVIS WITH CONTRAST  Technique:  Multidetector CT imaging of the abdomen and pelvis was performed following the standard protocol during bolus administration of intravenous contrast.  Contrast: OMNIPAQUE IOHEXOL 300 MG/ML  SOLN  Comparison: 09/27/2012  Findings: Lung bases:  Development of bibasilar collapse / consolidative change.  Slightly greater on the right than left.  Mild cardiomegaly with new trace bilateral pleural effusions.  Abdomen/pelvis:  Normal liver, spleen, stomach, pancreas, gallbladder, biliary tract, adrenal glands.  Too small to characterize right renal lesions which are likely cysts.  Normal left kidney.  Left-sided IVC. No retroperitoneal or retrocrural adenopathy.  Extensive colonic diverticulosis.  The colon is normal in caliber. Normal terminal ileum.  The appendix is dilated and thick-walled, including image 55/series 2.  There is intraluminal gas.  No extraluminal gas or periappendiceal fluid collection is seen.  Edema and peritoneal thickening are centered in the right lower quadrant, including on image 56/series 2.  Mild small bowel dilatation at up to 3.9 cm image 62/series 2.  No focal transition point identified.  Suspicion of mild distal ileal wall thickening, including image 75/series 2.  Tiny fat containing left inguinal hernia. No pelvic adenopathy. Normal urinary bladder and prostate.  Development of small volume cul-de-sac fluid, which appears simple.  Bones/Musculoskeletal:  No  acute osseous abnormality.  IMPRESSION:  1.  Development of inflammation centered about the right lower quadrant.  Concurrent appendiceal wall thickening and dilatation with intraluminal gas.  Suggestion of synchronous adjacent ileal wall thickening.  Considerations include an atypical appearance of appendicitis (with intraluminal gas) or infectious enteritis with secondary appendiceal inflammation. Case discussed with Dr. Donell Beers at 2 p.m. 2.  Small volume cul-de-sac fluid which is  new and presumably related to the right lower quadrant inflammatory process. 3.  Development of collapse / consolidative change at the lung bases.  Infection versus atelectasis.  Concurrent new small bilateral pleural effusions. 4.  Left-sided IVC.   Original Report Authenticated By: Jeronimo Greaves, M.D.    Anti-infectives: Anti-infectives   Start     Dose/Rate Route Frequency Ordered Stop   09/30/12 1600  piperacillin-tazobactam (ZOSYN) IVPB 3.375 g     3.375 g 12.5 mL/hr over 240 Minutes Intravenous Every 8 hours 09/30/12 1447     09/28/12 0900  ciprofloxacin (CIPRO) IVPB 400 mg  Status:  Discontinued     400 mg 200 mL/hr over 60 Minutes Intravenous Every 12 hours 09/28/12 0742 09/30/12 1447   09/28/12 0800  metroNIDAZOLE (FLAGYL) IVPB 500 mg  Status:  Discontinued     500 mg 100 mL/hr over 60 Minutes Intravenous Every 8 hours 09/28/12 0742 09/30/12 1447   09/27/12 2300  efavirenz-emtricitabine-tenofovir (ATRIPLA) 600-200-300 MG per tablet 1 tablet     1 tablet Oral Daily at bedtime 09/27/12 2210         Assessment/Plan  1. S/p lap and mini open appendectomy 2. HIV  Plan: 1. May be able to advance diet today.  Will d/w MD 2. Cont abx therapy 3. Get up and mobilize and pulm toilet   LOS: 4 days    Lockie Bothun E 10/01/2012, 10:12 AM Pager: [phone]

## 2012-10-02 DIAGNOSIS — K219 Gastro-esophageal reflux disease without esophagitis: Secondary | ICD-10-CM

## 2012-10-02 DIAGNOSIS — I10 Essential (primary) hypertension: Secondary | ICD-10-CM

## 2012-10-02 DIAGNOSIS — K352 Acute appendicitis with generalized peritonitis, without abscess: Principal | ICD-10-CM

## 2012-10-02 DIAGNOSIS — K3533 Acute appendicitis with perforation and localized peritonitis, with abscess: Secondary | ICD-10-CM | POA: Diagnosis present

## 2012-10-02 MED ORDER — HEPARIN SODIUM (PORCINE) 5000 UNIT/ML IJ SOLN
5000.0000 [IU] | Freq: Three times a day (TID) | INTRAMUSCULAR | Status: DC
  Administered 2012-10-02 – 2012-10-07 (×15): 5000 [IU] via SUBCUTANEOUS
  Filled 2012-10-02 (×19): qty 1

## 2012-10-02 NOTE — Progress Notes (Signed)
Continue on sips of clears as tolerated.  Ileus as expected.

## 2012-10-02 NOTE — Progress Notes (Signed)
[facility] PROGRESS NOTE  Russell Cooper MVH:[id number] DOB: 1970/10/22 DOA: 09/27/2012 PCP: Acey Lav, MD  Assessment/Plan:  1. Query appendicitis:  - General surgery on board - Will await disposition recommendations from surgery and ID. - Patient is s/p day 2 appendectomy - Patient is requesting to have scd's removed.  Will place on heparin and recheck platelet levels next am.  2. Fever:  - Resolving - Continue with current antibiotic and antiretroviral medication.  3. HIV Infection:  - Per ID - will follow up with their recommendations.  4. GERD: - Stable on IV Protonix  5. Dyslipidemia:  - Place on lipitor with improved po intake.  Code Status: full Family Communication: no family at bedside.  Disposition Plan: Pending further evaluation and recommendations   Consultants:  ID. Dr. Orvan Falconer  General surgery Dr. Donell Beers  Procedures:  S/p day 0 appendectomy  Antibiotics:  On Zosyn  Antivirals Atripla  HPI/Subjective: Patient has no new complaints at this moment.  Objective: Filed Vitals:   10/01/12 0530 10/01/12 1445 10/01/12 2200 10/02/12 0600  BP: 124/83 139/80 139/85 121/75  Pulse: 95 92 87 96  Temp: 98.5 F (36.9 C) 99.2 F (37.3 C) 98.4 F (36.9 C) 98 F (36.7 C)  TempSrc: Oral Oral Oral Oral  Resp: 16 18 18 18   Height:      Weight:      SpO2: 90% 94% 93% 90%    Intake/Output Summary (Last 24 hours) at 10/02/12 1208 Last data filed at 10/01/12 1833  Gross per 24 hour  Intake   1250 ml  Output    620 ml  Net    630 ml   Filed Weights   09/27/12 1605 09/27/12 2347  Weight: 86.183 kg (190 lb) 86.8 kg (191 lb 5.8 oz)    Exam:   General:  Pt in NAD, Alert and Awake  Cardiovascular: RRR, no MRG  Respiratory: CTA BL, no wheezes  Abdomen: Non distended  Musculoskeletal: no cyanosis or clubbing   Data Reviewed: Basic Metabolic Panel:  Recent Labs Lab 09/27/12 1715 09/28/12 0122 09/29/12 0515  09/30/12 0500 10/01/12 0106  NA 138 134* 133* 131* 132*  K 3.3* 3.2* 3.3* 3.3* 3.3*  CL 99 99 100 99 96  CO2 25 26 26 27 28   GLUCOSE 123* 142* 154* 142* 142*  BUN 8 8 6 6  5*  CREATININE 0.70 0.82 0.85 0.79 0.81  CALCIUM 9.3 8.4 7.9* 8.1* 7.7*   Liver Function Tests:  Recent Labs Lab 09/27/12 1715 09/29/12 0515  AST 21 12  ALT 22 15  ALKPHOS 61 41  BILITOT 0.8 0.9  PROT 7.8 5.9*  ALBUMIN 4.5 2.7*    Recent Labs Lab 09/27/12 1715  LIPASE 15   No results found for this basename: AMMONIA,  in the last 168 hours CBC:  Recent Labs Lab 09/27/12 1715 09/28/12 0122 09/29/12 0515 09/30/12 0500 10/01/12 0106  WBC 14.7* 15.6* 14.0* 13.6* 11.0*  NEUTROABS 13.2*  --   --   --  9.1*  HGB 15.1 12.7* 12.1* 12.7* 12.8*  HCT 42.3 37.2* 36.2* 37.8* 37.6*  MCV 94.6 93.7 94.5 94.3 94.2  PLT 94* 86* 78* 92* 103*   Cardiac Enzymes: No results found for this basename: CKTOTAL, CKMB, CKMBINDEX, TROPONINI,  in the last 168 hours BNP (last 3 results) No results found for this basename: PROBNP,  in the last 8760 hours CBG:  Recent Labs Lab 09/30/12 0726  GLUCAP 135*    Recent Results (from the  past 240 hour(s))  CULTURE, BLOOD (ROUTINE X 2)     Status: None   Collection Time    09/28/12  1:15 AM      Result Value Range Status   Specimen Description BLOOD RIGHT ARM   Final   Special Requests BOTTLES DRAWN AEROBIC AND ANAEROBIC    Final   Culture  Setup Time 09/28/2012 09:30   Final   Culture     Final   Value:        BLOOD CULTURE RECEIVED NO GROWTH TO DATE CULTURE WILL BE HELD FOR 5 DAYS BEFORE ISSUING A FINAL NEGATIVE REPORT   Report Status PENDING   Incomplete  CULTURE, BLOOD (ROUTINE X 2)     Status: None   Collection Time    09/28/12  1:22 AM      Result Value Range Status   Specimen Description BLOOD RIGHT ARM   Final   Special Requests BOTTLES DRAWN AEROBIC AND ANAEROBIC    Final   Culture  Setup Time 09/28/2012 09:30   Final   Culture     Final    Value:        BLOOD CULTURE RECEIVED NO GROWTH TO DATE CULTURE WILL BE HELD FOR 5 DAYS BEFORE ISSUING A FINAL NEGATIVE REPORT   Report Status PENDING   Incomplete  CLOSTRIDIUM DIFFICILE BY PCR     Status: None   Collection Time    09/28/12  1:59 PM      Result Value Range Status   C difficile by pcr NEGATIVE  NEGATIVE Final  SURGICAL PCR SCREEN     Status: None   Collection Time    09/30/12 10:00 AM      Result Value Range Status   MRSA, PCR NEGATIVE  NEGATIVE Final   Staphylococcus aureus NEGATIVE  NEGATIVE Final   Comment:            The Xpert SA Assay (FDA     approved for NASAL specimens     in patients over 10 years of age),     is one component of     a comprehensive surveillance     program.  Test performance has     been validated by [employer] for patients greater     than or equal to 40 year old.     It is not intended     to diagnose infection nor to     guide or monitor treatment.  URINE CULTURE     Status: None   Collection Time    09/30/12  1:00 PM      Result Value Range Status   Specimen Description URINE, CLEAN CATCH   Final   Special Requests NONE   Final   Culture  Setup Time 10/01/2012 01:36   Final   Colony Count NO GROWTH   Final   Culture NO GROWTH   Final   Report Status 10/01/2012 FINAL   Final     Studies: No results found.  Scheduled Meds: . atorvastatin  20 mg Oral Daily  . efavirenz-emtricitabine-tenofovir  1 tablet Oral QHS  . heparin subcutaneous  5,000 Units Subcutaneous Q8H  . lip balm  1 application Topical BID  . niacin  2,000 mg Oral QHS  . pantoprazole (PROTONIX) IV  40 mg Intravenous Q24H  . piperacillin-tazobactam (ZOSYN)  IV  3.375 g Intravenous Q8H  . saccharomyces boulardii  250 mg Oral BID  . sodium chloride  3 mL Intravenous Q12H   Continuous Infusions: . sodium chloride 100 mL/hr at 10/02/12 6213    Principal Problem:   Fever Active Problems:   HIV INFECTION   GERD   Nausea & vomiting   Abdominal pain,  bilateral lower quadrant   Diarrhea   Hyperglycemia   Hypokalemia   Appendicitis, acute, with peritonitis s/p lap appy June 2014    Time spent: > 35 minutes    [name]  [facility] Pager [phone]. If 7PM-7AM, please contact night-coverage at [email or web address], password [facility] 10/02/2012, 12:08 PM  LOS: 5 days

## 2012-10-02 NOTE — Progress Notes (Signed)
Pt on ra sat 84% applied o2 sat 92%.  Encouraged pt to use IS.

## 2012-10-02 NOTE — Progress Notes (Addendum)
o2 sat 86% on Ra o2 applied at 2liters. Dr Donell Beers informed on rounds at this time. Instructed pt on how to use IS.  Voices understanding.

## 2012-10-02 NOTE — Progress Notes (Addendum)
[facility]  Date of Admission:  09/27/2012  Antibiotics: Zosyn day 2 cipro flagyl 4 days  Subjective: No complaints  Objective: Temp:  [98 F (36.7 C)-99.2 F (37.3 C)] 98 F (36.7 C) (06/27 0600) Pulse Rate:  [87-96] 96 (06/27 0600) Resp:  [18] 18 (06/27 0600) BP: (121-139)/(75-85) 121/75 mmHg (06/27 0600) SpO2:  [90 %-94 %] 90 % (06/27 0600)  General: Awake, alert, nad Skin: no rashes Lungs: CTA B Cor: RRR   Lab Results Lab Results  Component Value Date   WBC 11.0* 10/01/2012   HGB 12.8* 10/01/2012   HCT 37.6* 10/01/2012   MCV 94.2 10/01/2012   PLT 103* 10/01/2012    Lab Results  Component Value Date   CREATININE 0.81 10/01/2012   BUN 5* 10/01/2012   NA 132* 10/01/2012   K 3.3* 10/01/2012   CL 96 10/01/2012   CO2 28 10/01/2012    Lab Results  Component Value Date   ALT 15 09/29/2012   AST 12 09/29/2012   ALKPHOS 41 09/29/2012   BILITOT 0.9 09/29/2012      Microbiology: Recent Results (from the past 240 hour(s))  CULTURE, BLOOD (ROUTINE X 2)     Status: None   Collection Time    09/28/12  1:15 AM      Result Value Range Status   Specimen Description BLOOD RIGHT ARM   Final   Special Requests BOTTLES DRAWN AEROBIC AND ANAEROBIC    Final   Culture  Setup Time 09/28/2012 09:30   Final   Culture     Final   Value:        BLOOD CULTURE RECEIVED NO GROWTH TO DATE CULTURE WILL BE HELD FOR 5 DAYS BEFORE ISSUING A FINAL NEGATIVE REPORT   Report Status PENDING   Incomplete  CULTURE, BLOOD (ROUTINE X 2)     Status: None   Collection Time    09/28/12  1:22 AM      Result Value Range Status   Specimen Description BLOOD RIGHT ARM   Final   Special Requests BOTTLES DRAWN AEROBIC AND ANAEROBIC    Final   Culture  Setup Time 09/28/2012 09:30   Final   Culture     Final   Value:        BLOOD CULTURE RECEIVED NO GROWTH TO DATE CULTURE WILL BE HELD FOR 5 DAYS BEFORE ISSUING A FINAL NEGATIVE REPORT   Report Status PENDING   Incomplete    CLOSTRIDIUM DIFFICILE BY PCR     Status: None   Collection Time    09/28/12  1:59 PM      Result Value Range Status   C difficile by pcr NEGATIVE  NEGATIVE Final  SURGICAL PCR SCREEN     Status: None   Collection Time    09/30/12 10:00 AM      Result Value Range Status   MRSA, PCR NEGATIVE  NEGATIVE Final   Staphylococcus aureus NEGATIVE  NEGATIVE Final   Comment:            The Xpert SA Assay (FDA     approved for NASAL specimens     in patients over 84 years of age),     is one component of     a comprehensive surveillance     program.  Test performance has     been validated by [employer] for patients greater     than or equal to  20 year old.     It is not intended     to diagnose infection nor to     guide or monitor treatment.  URINE CULTURE     Status: None   Collection Time    09/30/12  1:00 PM      Result Value Range Status   Specimen Description URINE, CLEAN CATCH   Final   Special Requests NONE   Final   Culture  Setup Time 10/01/2012 01:36   Final   Colony Count NO GROWTH   Final   Culture NO GROWTH   Final   Report Status 10/01/2012 FINAL   Final    Studies/Results: No results found.  Assessment/Plan: 1) GI - stable, some enteritis, appendicitis.  On Zosyn and now afebrile.  I think antibiotics for 3-5 days post op is sufficient unless otherwise directed by surgery.  If nausea resolves, could change Zosyn to po levaquin and flagyl.    2) HIV - getting Atripla  I will be available over the weekend if needed.   Staci Righter, MD [facility] [email or web address] [phone] pager   [phone] cell 10/02/2012, 1:42 PM

## 2012-10-02 NOTE — Progress Notes (Signed)
Patient ID: Russell Cooper, male   DOB: 19-Sep-1970, 42 y.o.   MRN: [id number] 2 Days Post-Op  Subjective: Pt nauseated this morning after smelling clear liquids.  Had an episode of emesis.  No flatus.  Objective: Vital signs in last 24 hours: Temp:  [98 F (36.7 C)-99.2 F (37.3 C)] 98 F (36.7 C) (06/27 0600) Pulse Rate:  [87-96] 96 (06/27 0600) Resp:  [18] 18 (06/27 0600) BP: (121-139)/(75-85) 121/75 mmHg (06/27 0600) SpO2:  [90 %-94 %] 90 % (06/27 0600) Last BM Date: 09/29/12  Intake/Output from previous day: 06/26 0701 - 06/27 0700 In: 1300 [I.V.:1200; IV Piggyback:100] Out: 620 [Urine:600; Drains:20] Intake/Output this shift:    PE: Abd: soft, but increased distention today, hypoactive BS, incisions c/d/i, JP with serous fluid  Lab Results:   Recent Labs  09/30/12 0500 10/01/12 0106  WBC 13.6* 11.0*  HGB 12.7* 12.8*  HCT 37.8* 37.6*  PLT 92* 103*   BMET  Recent Labs  09/30/12 0500 10/01/12 0106  NA 131* 132*  K 3.3* 3.3*  CL 99 96  CO2 27 28  GLUCOSE 142* 142*  BUN 6 5*  CREATININE 0.79 0.81  CALCIUM 8.1* 7.7*   PT/INR No results found for this basename: LABPROT, INR,  in the last 72 hours CMP     Component Value Date/Time   NA 132* 10/01/2012 0106   K 3.3* 10/01/2012 0106   CL 96 10/01/2012 0106   CO2 28 10/01/2012 0106   GLUCOSE 142* 10/01/2012 0106   BUN 5* 10/01/2012 0106   CREATININE 0.81 10/01/2012 0106   CREATININE 0.91 06/03/2012 0951   CALCIUM 7.7* 10/01/2012 0106   PROT 5.9* 09/29/2012 0515   ALBUMIN 2.7* 09/29/2012 0515   AST 12 09/29/2012 0515   ALT 15 09/29/2012 0515   ALKPHOS 41 09/29/2012 0515   BILITOT 0.9 09/29/2012 0515   GFRNONAA >90 10/01/2012 0106   GFRAA >90 10/01/2012 0106   Lipase     Component Value Date/Time   LIPASE 15 09/27/2012 1715       Studies/Results: No results found.  Anti-infectives: Anti-infectives   Start     Dose/Rate Route Frequency Ordered Stop   09/30/12 1600  piperacillin-tazobactam (ZOSYN) IVPB  3.375 g     3.375 g 12.5 mL/hr over 240 Minutes Intravenous Every 8 hours 09/30/12 1447     09/28/12 0900  ciprofloxacin (CIPRO) IVPB 400 mg  Status:  Discontinued     400 mg 200 mL/hr over 60 Minutes Intravenous Every 12 hours 09/28/12 0742 09/30/12 1447   09/28/12 0800  metroNIDAZOLE (FLAGYL) IVPB 500 mg  Status:  Discontinued     500 mg 100 mL/hr over 60 Minutes Intravenous Every 8 hours 09/28/12 0742 09/30/12 1447   09/27/12 2300  efavirenz-emtricitabine-tenofovir (ATRIPLA) 600-200-300 MG per tablet 1 tablet     1 tablet Oral Daily at bedtime 09/27/12 2210         Assessment/Plan  1. S/p lap and mini open appendectomy for gangrenous appendicitis 2. Post op ileus 3. HIV  Plan: 1. Remain on clear liquids, but if nausea and emesis persists he may need to return to ice and npo 2. Unclear why, but patient not on DVT prophylaxis, primary team said that he would address this.  Otherwise, I'm happy to if needed.  3. Patient needs to continue to mobilize and pulm toilet 4. Has broken his fever curve.  AF for 24 hrs 5. Cont abx therapy  LOS: 5 days    Austin Herd E  10/02/2012, 10:06 AM Pager: [phone]

## 2012-10-03 ENCOUNTER — Inpatient Hospital Stay (HOSPITAL_COMMUNITY): Payer: BC Managed Care – PPO

## 2012-10-03 LAB — BASIC METABOLIC PANEL
CO2: 30 mEq/L (ref 19–32)
Chloride: 97 mEq/L (ref 96–112)
Creatinine, Ser: 0.72 mg/dL (ref 0.50–1.35)
Glucose, Bld: 130 mg/dL — ABNORMAL HIGH (ref 70–99)

## 2012-10-03 LAB — CBC
Hemoglobin: 11.3 g/dL — ABNORMAL LOW (ref 13.0–17.0)
MCV: 94.4 fL (ref 78.0–100.0)
Platelets: 142 10*3/uL — ABNORMAL LOW (ref 150–400)
RBC: 3.6 MIL/uL — ABNORMAL LOW (ref 4.22–5.81)
WBC: 10.3 10*3/uL (ref 4.0–10.5)

## 2012-10-03 MED ORDER — PANTOPRAZOLE SODIUM 40 MG PO TBEC
40.0000 mg | DELAYED_RELEASE_TABLET | Freq: Every day | ORAL | Status: DC
  Administered 2012-10-04 – 2012-10-07 (×4): 40 mg via ORAL
  Filled 2012-10-03 (×4): qty 1

## 2012-10-03 MED ORDER — POTASSIUM CHLORIDE 10 MEQ/100ML IV SOLN
10.0000 meq | INTRAVENOUS | Status: AC
  Administered 2012-10-03 (×6): 10 meq via INTRAVENOUS
  Filled 2012-10-03 (×6): qty 100

## 2012-10-03 NOTE — Progress Notes (Signed)
Patient had an episode of sudden nausea and vomiting. Approximately 200 of dark green emesis.

## 2012-10-03 NOTE — Progress Notes (Signed)
This patient is receiving Protonix. Based on criteria approved by the Pharmacy and Therapeutics Committee, this medication is being converted to the equivalent oral dose form. These criteria include:   . The patient is eating (either orally or per tube) and/or has been taking other orally administered medications for at least 24 hours.  . This patient has no evidence of active gastrointestinal bleeding or impaired GI absorption (gastrectomy, short bowel, patient on TNA or NPO).   If you have questions about this conversion, please contact the pharmacy department.  Russell Cooper, [facility] 10/03/2012 2:51 PM

## 2012-10-03 NOTE — Progress Notes (Signed)
Patient ID: Russell Cooper, male   DOB: [birth year], 42 y.o.   MRN: [id number] 3 Days Post-Op  Subjective: Pt had flatus and small BM.  No more nausea yesterday.    Objective: Vital signs in last 24 hours: Temp:  [98.1 F (36.7 C)-99.3 F (37.4 C)] 98.1 F (36.7 C) (06/28 0600) Pulse Rate:  [78-93] 78 (06/28 0600) Resp:  [18] 18 (06/28 0600) BP: (130-146)/(74-82) 146/82 mmHg (06/28 0600) SpO2:  [92 %-98 %] 98 % (06/28 0600) Last BM Date: 10/02/12  Intake/Output from previous day: 06/27 0701 - 06/28 0700 In: 3950 [I.V.:3730; IV Piggyback:200] Out: 610 [Urine:600; Drains:10] Intake/Output this shift:    PE: Abd: soft, less bloated today.  Minimal appropriate tenderness.  JP serosang.  Lab Results:   Recent Labs  10/01/12 0106 10/03/12 0545  WBC 11.0* 10.3  HGB 12.8* 11.3*  HCT 37.6* 34.0*  PLT 103* 142*   BMET  Recent Labs  10/01/12 0106 10/03/12 0545  NA 132* 134*  K 3.3* 3.2*  CL 96 97  CO2 28 30  GLUCOSE 142* 130*  BUN 5* 4*  CREATININE 0.81 0.72  CALCIUM 7.7* 8.1*   PT/INR No results found for this basename: LABPROT, INR,  in the last 72 hours CMP     Component Value Date/Time   NA 134* 10/03/2012 0545   K 3.2* 10/03/2012 0545   CL 97 10/03/2012 0545   CO2 30 10/03/2012 0545   GLUCOSE 130* 10/03/2012 0545   BUN 4* 10/03/2012 0545   CREATININE 0.72 10/03/2012 0545   CREATININE 0.91 06/03/2012 0951   CALCIUM 8.1* 10/03/2012 0545   PROT 5.9* 09/29/2012 0515   ALBUMIN 2.7* 09/29/2012 0515   AST 12 09/29/2012 0515   ALT 15 09/29/2012 0515   ALKPHOS 41 09/29/2012 0515   BILITOT 0.9 09/29/2012 0515   GFRNONAA >90 10/03/2012 0545   GFRAA >90 10/03/2012 0545   Lipase     Component Value Date/Time   LIPASE 15 09/27/2012 1715       Studies/Results: No results found.  Anti-infectives: Anti-infectives   Start     Dose/Rate Route Frequency Ordered Stop   09/30/12 1600  piperacillin-tazobactam (ZOSYN) IVPB 3.375 g     3.375 g 12.5 mL/hr over 240 Minutes  Intravenous Every 8 hours 09/30/12 1447     09/28/12 0900  ciprofloxacin (CIPRO) IVPB 400 mg  Status:  Discontinued     400 mg 200 mL/hr over 60 Minutes Intravenous Every 12 hours 09/28/12 0742 09/30/12 1447   09/28/12 0800  metroNIDAZOLE (FLAGYL) IVPB 500 mg  Status:  Discontinued     500 mg 100 mL/hr over 60 Minutes Intravenous Every 8 hours 09/28/12 0742 09/30/12 1447   09/27/12 2300  efavirenz-emtricitabine-tenofovir (ATRIPLA) 600-200-300 MG per tablet 1 tablet     1 tablet Oral Daily at bedtime 09/27/12 2210         Assessment/Plan  1. S/p lap and mini open appendectomy for gangrenous appendicitis 2. Post op ileus 3. HIV 4. Urinary retention.    Plan: 1. Advance to full liquids.   2. DVT prophylaxis  3. Pulmonary toilet.   4.  Antibiotics. If tolerating full liquids and is less bloated tomorrow, would plan regular diet in AM and transition to oral antibiotics.  Could potentially go home Monday if ileus fully resolved and afebrile on oral antibiotics.      LOS: 6 days    Corben Auzenne 10/03/2012, 9:31 AM Pager: [phone]

## 2012-10-03 NOTE — Progress Notes (Addendum)
[facility] PROGRESS NOTE  Russell Cooper AVW:[id number] DOB: [birth year] DOA: 09/27/2012 PCP: Acey Lav, MD  Assessment/Plan:  1. Query appendicitis:  - General surgery on board - Will await disposition recommendations from surgery and ID. - Patient is s/p day 3 appendectomy - If tolerating po intake tomorrow after diet is advanced, discharge is anticipated.  - Patient had some hypoxia but I suspect that this is most likely due to atelectasis.  Will obtain a 2 view x ray to reassess.  Although of note patient looks comfortable on room air and is not having productive cough.  2. Fever:  - Resolved - Continue with current antibiotic and antiretroviral medication.  3. HIV Infection:  - Per ID - will follow up with their recommendations.  4. GERD: - Stable on IV Protonix  5. Dyslipidemia:  - Place on lipitor with improved po intake.  Code Status: full Family Communication: no family at bedside.  Disposition Plan: If improved oral intake likely d/c next am.   Consultants:  ID. Dr. Orvan Falconer  General surgery Dr. Donell Beers  Procedures:  S/p day 0 appendectomy  Antibiotics:  On Zosyn  Antivirals Atripla  HPI/Subjective: Patient has no new complaints at this moment.  Objective: Filed Vitals:   10/02/12 1344 10/02/12 2330 10/03/12 0600 10/03/12 1030  BP: 130/78 130/74 146/82   Pulse: 93 84 78   Temp: 99.3 F (37.4 C) 98.6 F (37 C) 98.1 F (36.7 C)   TempSrc: Oral Oral Oral   Resp: 18 18 18    Height:      Weight:      SpO2: 94% 92% 98% 94%    Intake/Output Summary (Last 24 hours) at 10/03/12 1317 Last data filed at 10/03/12 1200  Gross per 24 hour  Intake   4190 ml  Output   1860 ml  Net   2330 ml   Filed Weights   09/27/12 1605 09/27/12 2347  Weight: 86.183 kg (190 lb) 86.8 kg (191 lb 5.8 oz)    Exam:   General:  Pt in NAD, Alert and Awake  Cardiovascular: RRR, addendum no rubs  Respiratory: CTA BL, no wheezes  Abdomen: Non  distended  Musculoskeletal: no cyanosis or clubbing   Data Reviewed: Basic Metabolic Panel:  Recent Labs Lab 09/28/12 0122 09/29/12 0515 09/30/12 0500 10/01/12 0106 10/03/12 0545  NA 134* 133* 131* 132* 134*  K 3.2* 3.3* 3.3* 3.3* 3.2*  CL 99 100 99 96 97  CO2 26 26 27 28 30   GLUCOSE 142* 154* 142* 142* 130*  BUN 8 6 6  5* 4*  CREATININE 0.82 0.85 0.79 0.81 0.72  CALCIUM 8.4 7.9* 8.1* 7.7* 8.1*   Liver Function Tests:  Recent Labs Lab 09/27/12 1715 09/29/12 0515  AST 21 12  ALT 22 15  ALKPHOS 61 41  BILITOT 0.8 0.9  PROT 7.8 5.9*  ALBUMIN 4.5 2.7*    Recent Labs Lab 09/27/12 1715  LIPASE 15   No results found for this basename: AMMONIA,  in the last 168 hours CBC:  Recent Labs Lab 09/27/12 1715 09/28/12 0122 09/29/12 0515 09/30/12 0500 10/01/12 0106 10/03/12 0545  WBC 14.7* 15.6* 14.0* 13.6* 11.0* 10.3  NEUTROABS 13.2*  --   --   --  9.1*  --   HGB 15.1 12.7* 12.1* 12.7* 12.8* 11.3*  HCT 42.3 37.2* 36.2* 37.8* 37.6* 34.0*  MCV 94.6 93.7 94.5 94.3 94.2 94.4  PLT 94* 86* 78* 92* 103* 142*   Cardiac Enzymes: No results found for  this basename: CKTOTAL, CKMB, CKMBINDEX, TROPONINI,  in the last 168 hours BNP (last 3 results) No results found for this basename: PROBNP,  in the last 8760 hours CBG:  Recent Labs Lab 09/30/12 0726  GLUCAP 135*    Recent Results (from the past 240 hour(s))  CULTURE, BLOOD (ROUTINE X 2)     Status: None   Collection Time    09/28/12  1:15 AM      Result Value Range Status   Specimen Description BLOOD RIGHT ARM   Final   Special Requests BOTTLES DRAWN AEROBIC AND ANAEROBIC    Final   Culture  Setup Time 09/28/2012 09:30   Final   Culture     Final   Value:        BLOOD CULTURE RECEIVED NO GROWTH TO DATE CULTURE WILL BE HELD FOR 5 DAYS BEFORE ISSUING A FINAL NEGATIVE REPORT   Report Status PENDING   Incomplete  CULTURE, BLOOD (ROUTINE X 2)     Status: None   Collection Time    09/28/12  1:22 AM      Result  Value Range Status   Specimen Description BLOOD RIGHT ARM   Final   Special Requests BOTTLES DRAWN AEROBIC AND ANAEROBIC    Final   Culture  Setup Time 09/28/2012 09:30   Final   Culture     Final   Value:        BLOOD CULTURE RECEIVED NO GROWTH TO DATE CULTURE WILL BE HELD FOR 5 DAYS BEFORE ISSUING A FINAL NEGATIVE REPORT   Report Status PENDING   Incomplete  CLOSTRIDIUM DIFFICILE BY PCR     Status: None   Collection Time    09/28/12  1:59 PM      Result Value Range Status   C difficile by pcr NEGATIVE  NEGATIVE Final  SURGICAL PCR SCREEN     Status: None   Collection Time    09/30/12 10:00 AM      Result Value Range Status   MRSA, PCR NEGATIVE  NEGATIVE Final   Staphylococcus aureus NEGATIVE  NEGATIVE Final   Comment:            The Xpert SA Assay (FDA     approved for NASAL specimens     in patients over 68 years of age),     is one component of     a comprehensive surveillance     program.  Test performance has     been validated by [employer] for patients greater     than or equal to 40 year old.     It is not intended     to diagnose infection nor to     guide or monitor treatment.  URINE CULTURE     Status: None   Collection Time    09/30/12  1:00 PM      Result Value Range Status   Specimen Description URINE, CLEAN CATCH   Final   Special Requests NONE   Final   Culture  Setup Time 10/01/2012 01:36   Final   Colony Count NO GROWTH   Final   Culture NO GROWTH   Final   Report Status 10/01/2012 FINAL   Final     Studies: Dg Chest 2 View  10/03/2012   *RADIOLOGY REPORT*  Clinical Data: Status post appendectomy.  CHEST - 2 VIEW  Comparison: 10/03/2012 at 09:48 hours  Findings: No significant interval change.  Patchy  right perihilar, right basilar and left basilar opacities.  No effusions or pneumothoraces.  Heart and mediastinal structures appear normal.  IMPRESSION: The bilateral opacities which may represent changes due to atelectasis and/or infiltrate.  No  significant interval change from the prior study.   Original Report Authenticated By: Sander Radon, M.D.   [facility] 1 View  10/03/2012   *RADIOLOGY REPORT*  Clinical Data: Hypoxia, fever, postop appendectomy.  PORTABLE CHEST - 1 VIEW  Comparison: 02/06/2008.  Findings: Semi lordotic projection.  The heart and mediastinal contours are normal.  Hazy interstitial changes are present in each right perihilar region.  Linear atelectasis overlies the left heart border.  There is slightly greater opacity behind the heart obscuring the margin of the left hemidiaphragm.  The lungs otherwise appear clear.  There are no effusions or pneumothoraces. There is prominence of the small bowel loops in the left upper quadrant of the abdomen.  IMPRESSION: The changes in the lungs may be due to atelectasis alone. Postoperative pneumonia, especially in the left base, should also be considered clinically.  The patient does have some prominent soft small bowel loops likely related to ileus.   Original Report Authenticated By: Sander Radon, M.D.    Scheduled Meds: . atorvastatin  20 mg Oral Daily  . efavirenz-emtricitabine-tenofovir  1 tablet Oral QHS  . heparin subcutaneous  5,000 Units Subcutaneous Q8H  . lip balm  1 application Topical BID  . niacin  2,000 mg Oral QHS  . pantoprazole (PROTONIX) IV  40 mg Intravenous Q24H  . piperacillin-tazobactam (ZOSYN)  IV  3.375 g Intravenous Q8H  . potassium chloride  10 mEq Intravenous Q1 Hr x 6  . saccharomyces boulardii  250 mg Oral BID  . sodium chloride  3 mL Intravenous Q12H   Continuous Infusions: . sodium chloride 100 mL/hr at 10/03/12 0051    Principal Problem:   Fever Active Problems:   HIV INFECTION   GERD   Nausea & vomiting   Abdominal pain, bilateral lower quadrant   Diarrhea   Hyperglycemia   Hypokalemia   Appendicitis, acute, with peritonitis s/p lap appy June 2014    Time spent: > 35 minutes    [name]  [facility] Pager [phone]. If 7PM-7AM, please contact night-coverage at [email or web address], password [facility] 10/03/2012, 1:17 PM  LOS: 6 days

## 2012-10-04 DIAGNOSIS — F411 Generalized anxiety disorder: Secondary | ICD-10-CM

## 2012-10-04 LAB — CULTURE, BLOOD (ROUTINE X 2)
Culture: NO GROWTH
Culture: NO GROWTH

## 2012-10-04 NOTE — Progress Notes (Signed)
[facility] PROGRESS NOTE  Russell Cooper ZOX:[id number] DOB: [birth year] DOA: 09/27/2012 PCP: Acey Lav, MD  Assessment/Plan:  1. Query appendicitis:  - General surgery on board - Patient is s/p day 4 appendectomy - If tolerating po intake tomorrow after diet is advanced, discharge is anticipated.  - Suspecting atelectasis as opposed to pna, patient denies cough and currently breathing comfortably on room air.  2. Fever:  - Resolved - Continue with current antibiotic and antiretroviral medication.  3. HIV Infection:  - Per ID - will follow up with their recommendations.  4. GERD: - Stable on IV Protonix  5. Dyslipidemia:  - Place on lipitor with improved po intake.  Code Status: full Family Communication: no family at bedside.  Disposition Plan: If improved oral intake likely d/c next am.   Consultants:  ID. Dr. Orvan Falconer  General surgery Dr. Donell Beers  Procedures:  S/p day 0 appendectomy  Antibiotics:  On Zosyn  Antivirals Atripla  HPI/Subjective: Patient has no new complaints at this moment.  Objective: Filed Vitals:   10/03/12 1348 10/03/12 1930 10/04/12 0545 10/04/12 1316  BP: 144/83 130/77 121/89 155/83  Pulse: 68 73 82 73  Temp: 99.2 F (37.3 C) 99.6 F (37.6 C) 99.4 F (37.4 C) 99 F (37.2 C)  TempSrc: Oral Oral Oral Oral  Resp: 18 18 18 18   Height:      Weight:      SpO2: 98% 92% 92% 97%    Intake/Output Summary (Last 24 hours) at 10/04/12 1502 Last data filed at 10/04/12 1300  Gross per 24 hour  Intake   2935 ml  Output   1960 ml  Net    975 ml   Filed Weights   09/27/12 1605 09/27/12 2347  Weight: 86.183 kg (190 lb) 86.8 kg (191 lb 5.8 oz)    Exam:   General:  Pt in NAD, Alert and Awake  Cardiovascular: RRR, no rubs or gallops  Respiratory: CTA BL, no wheezes  Abdomen: Non distended  Musculoskeletal: no cyanosis or clubbing   Data Reviewed: Basic Metabolic Panel:  Recent Labs Lab 09/28/12 0122  09/29/12 0515 09/30/12 0500 10/01/12 0106 10/03/12 0545  NA 134* 133* 131* 132* 134*  K 3.2* 3.3* 3.3* 3.3* 3.2*  CL 99 100 99 96 97  CO2 26 26 27 28 30   GLUCOSE 142* 154* 142* 142* 130*  BUN 8 6 6  5* 4*  CREATININE 0.82 0.85 0.79 0.81 0.72  CALCIUM 8.4 7.9* 8.1* 7.7* 8.1*   Liver Function Tests:  Recent Labs Lab 09/27/12 1715 09/29/12 0515  AST 21 12  ALT 22 15  ALKPHOS 61 41  BILITOT 0.8 0.9  PROT 7.8 5.9*  ALBUMIN 4.5 2.7*    Recent Labs Lab 09/27/12 1715  LIPASE 15   No results found for this basename: AMMONIA,  in the last 168 hours CBC:  Recent Labs Lab 09/27/12 1715 09/28/12 0122 09/29/12 0515 09/30/12 0500 10/01/12 0106 10/03/12 0545  WBC 14.7* 15.6* 14.0* 13.6* 11.0* 10.3  NEUTROABS 13.2*  --   --   --  9.1*  --   HGB 15.1 12.7* 12.1* 12.7* 12.8* 11.3*  HCT 42.3 37.2* 36.2* 37.8* 37.6* 34.0*  MCV 94.6 93.7 94.5 94.3 94.2 94.4  PLT 94* 86* 78* 92* 103* 142*   Cardiac Enzymes: No results found for this basename: CKTOTAL, CKMB, CKMBINDEX, TROPONINI,  in the last 168 hours BNP (last 3 results) No results found for this basename: PROBNP,  in the last 8760 hours  CBG:  Recent Labs Lab 09/30/12 0726  GLUCAP 135*    Recent Results (from the past 240 hour(s))  CULTURE, BLOOD (ROUTINE X 2)     Status: None   Collection Time    09/28/12  1:15 AM      Result Value Range Status   Specimen Description BLOOD RIGHT ARM   Final   Special Requests BOTTLES DRAWN AEROBIC AND ANAEROBIC    Final   Culture  Setup Time 09/28/2012 09:30   Final   Culture NO GROWTH 5 DAYS   Final   Report Status 10/04/2012 FINAL   Final  CULTURE, BLOOD (ROUTINE X 2)     Status: None   Collection Time    09/28/12  1:22 AM      Result Value Range Status   Specimen Description BLOOD RIGHT ARM   Final   Special Requests BOTTLES DRAWN AEROBIC AND ANAEROBIC    Final   Culture  Setup Time 09/28/2012 09:30   Final   Culture NO GROWTH 5 DAYS   Final   Report Status  10/04/2012 FINAL   Final  CLOSTRIDIUM DIFFICILE BY PCR     Status: None   Collection Time    09/28/12  1:59 PM      Result Value Range Status   C difficile by pcr NEGATIVE  NEGATIVE Final  SURGICAL PCR SCREEN     Status: None   Collection Time    09/30/12 10:00 AM      Result Value Range Status   MRSA, PCR NEGATIVE  NEGATIVE Final   Staphylococcus aureus NEGATIVE  NEGATIVE Final   Comment:            The Xpert SA Assay (FDA     approved for NASAL specimens     in patients over 80 years of age),     is one component of     a comprehensive surveillance     program.  Test performance has     been validated by [employer] for patients greater     than or equal to 50 year old.     It is not intended     to diagnose infection nor to     guide or monitor treatment.  URINE CULTURE     Status: None   Collection Time    09/30/12  1:00 PM      Result Value Range Status   Specimen Description URINE, CLEAN CATCH   Final   Special Requests NONE   Final   Culture  Setup Time 10/01/2012 01:36   Final   Colony Count NO GROWTH   Final   Culture NO GROWTH   Final   Report Status 10/01/2012 FINAL   Final     Studies: Dg Chest 2 View  10/03/2012   *RADIOLOGY REPORT*  Clinical Data: Status post appendectomy.  CHEST - 2 VIEW  Comparison: 10/03/2012 at 09:48 hours  Findings: No significant interval change.  Patchy right perihilar, right basilar and left basilar opacities.  No effusions or pneumothoraces.  Heart and mediastinal structures appear normal.  IMPRESSION: The bilateral opacities which may represent changes due to atelectasis and/or infiltrate.  No significant interval change from the prior study.   Original Report Authenticated By: Sander Radon, M.D.   [facility] 1 View  10/03/2012   *RADIOLOGY REPORT*  Clinical Data: Hypoxia, fever, postop appendectomy.  PORTABLE CHEST - 1 VIEW  Comparison: 02/06/2008.  Findings:  Semi lordotic projection.  The heart and mediastinal contours  are normal.  Hazy interstitial changes are present in each right perihilar region.  Linear atelectasis overlies the left heart border.  There is slightly greater opacity behind the heart obscuring the margin of the left hemidiaphragm.  The lungs otherwise appear clear.  There are no effusions or pneumothoraces. There is prominence of the small bowel loops in the left upper quadrant of the abdomen.  IMPRESSION: The changes in the lungs may be due to atelectasis alone. Postoperative pneumonia, especially in the left base, should also be considered clinically.  The patient does have some prominent soft small bowel loops likely related to ileus.   Original Report Authenticated By: Sander Radon, M.D.    Scheduled Meds: . atorvastatin  20 mg Oral Daily  . efavirenz-emtricitabine-tenofovir  1 tablet Oral QHS  . heparin subcutaneous  5,000 Units Subcutaneous Q8H  . lip balm  1 application Topical BID  . niacin  2,000 mg Oral QHS  . pantoprazole  40 mg Oral Daily  . piperacillin-tazobactam (ZOSYN)  IV  3.375 g Intravenous Q8H  . saccharomyces boulardii  250 mg Oral BID  . sodium chloride  3 mL Intravenous Q12H   Continuous Infusions: . sodium chloride 50 mL/hr at 10/04/12 1478    Principal Problem:   Fever Active Problems:   HIV INFECTION   GERD   Nausea & vomiting   Abdominal pain, bilateral lower quadrant   Diarrhea   Hyperglycemia   Hypokalemia   Appendicitis, acute, with peritonitis s/p lap appy June 2014    Time spent: > 35 minutes    [name]  [facility] Pager [phone]. If 7PM-7AM, please contact night-coverage at [email or web address], password [facility] 10/04/2012, 3:02 PM  LOS: 7 days

## 2012-10-04 NOTE — Progress Notes (Signed)
Patient ID: Russell Cooper, male   DOB: Jul 13, 1970, 42 y.o.   MRN: [id number] 4 Days Post-Op  Subjective: Pt had quite a bit more flatus.  No other BM.  Some nausea yesterday AM, but none afterward.  Objective: Vital signs in last 24 hours: Temp:  [99.2 F (37.3 C)-99.6 F (37.6 C)] 99.4 F (37.4 C) (06/29 0545) Pulse Rate:  [68-82] 82 (06/29 0545) Resp:  [18] 18 (06/29 0545) BP: (121-144)/(77-89) 121/89 mmHg (06/29 0545) SpO2:  [92 %-98 %] 92 % (06/29 0545) Last BM Date: 10/02/12  Intake/Output from previous day: 06/28 0701 - 06/29 0700 In: 2900 [P.O.:600; I.V.:2300] Out: 2400 [Urine:2400] Intake/Output this shift:    PE: Abd: soft, much less bloated today.  Minimal appropriate tenderness.  JP serosang.  Lab Results:   Recent Labs  10/03/12 0545  WBC 10.3  HGB 11.3*  HCT 34.0*  PLT 142*   BMET  Recent Labs  10/03/12 0545  NA 134*  K 3.2*  CL 97  CO2 30  GLUCOSE 130*  BUN 4*  CREATININE 0.72  CALCIUM 8.1*   PT/INR No results found for this basename: LABPROT, INR,  in the last 72 hours CMP     Component Value Date/Time   NA 134* 10/03/2012 0545   K 3.2* 10/03/2012 0545   CL 97 10/03/2012 0545   CO2 30 10/03/2012 0545   GLUCOSE 130* 10/03/2012 0545   BUN 4* 10/03/2012 0545   CREATININE 0.72 10/03/2012 0545   CREATININE 0.91 06/03/2012 0951   CALCIUM 8.1* 10/03/2012 0545   PROT 5.9* 09/29/2012 0515   ALBUMIN 2.7* 09/29/2012 0515   AST 12 09/29/2012 0515   ALT 15 09/29/2012 0515   ALKPHOS 41 09/29/2012 0515   BILITOT 0.9 09/29/2012 0515   GFRNONAA >90 10/03/2012 0545   GFRAA >90 10/03/2012 0545   Lipase     Component Value Date/Time   LIPASE 15 09/27/2012 1715       Studies/Results: Dg Chest 2 View  10/03/2012   *RADIOLOGY REPORT*  Clinical Data: Status post appendectomy.  CHEST - 2 VIEW  Comparison: 10/03/2012 at 09:48 hours  Findings: No significant interval change.  Patchy right perihilar, right basilar and left basilar opacities.  No effusions or  pneumothoraces.  Heart and mediastinal structures appear normal.  IMPRESSION: The bilateral opacities which may represent changes due to atelectasis and/or infiltrate.  No significant interval change from the prior study.   Original Report Authenticated By: Sander Radon, M.D.   [facility] 1 View  10/03/2012   *RADIOLOGY REPORT*  Clinical Data: Hypoxia, fever, postop appendectomy.  PORTABLE CHEST - 1 VIEW  Comparison: 02/06/2008.  Findings: Semi lordotic projection.  The heart and mediastinal contours are normal.  Hazy interstitial changes are present in each right perihilar region.  Linear atelectasis overlies the left heart border.  There is slightly greater opacity behind the heart obscuring the margin of the left hemidiaphragm.  The lungs otherwise appear clear.  There are no effusions or pneumothoraces. There is prominence of the small bowel loops in the left upper quadrant of the abdomen.  IMPRESSION: The changes in the lungs may be due to atelectasis alone. Postoperative pneumonia, especially in the left base, should also be considered clinically.  The patient does have some prominent soft small bowel loops likely related to ileus.   Original Report Authenticated By: Sander Radon, M.D.    Anti-infectives: Anti-infectives   Start     Dose/Rate Route Frequency Ordered Stop   09/30/12  1600  piperacillin-tazobactam (ZOSYN) IVPB 3.375 g     3.375 g 12.5 mL/hr over 240 Minutes Intravenous Every 8 hours 09/30/12 1447     09/28/12 0900  ciprofloxacin (CIPRO) IVPB 400 mg  Status:  Discontinued     400 mg 200 mL/hr over 60 Minutes Intravenous Every 12 hours 09/28/12 0742 09/30/12 1447   09/28/12 0800  metroNIDAZOLE (FLAGYL) IVPB 500 mg  Status:  Discontinued     500 mg 100 mL/hr over 60 Minutes Intravenous Every 8 hours 09/28/12 0742 09/30/12 1447   09/27/12 2300  efavirenz-emtricitabine-tenofovir (ATRIPLA) 600-200-300 MG per tablet 1 tablet     1 tablet Oral Daily at bedtime 09/27/12 2210          Assessment/Plan  1. S/p lap and mini open appendectomy for gangrenous appendicitis 2. Post op ileus 3. HIV 4. Urinary retention.    Plan: 1. As long as no n/v, pt can have reg diet for dinner..   2. DVT prophylaxis  3. Pulmonary toilet.   4.  Antibiotics- still had 99.6 temp, would stay on IV antibiotics today.  Recheck WBCs in AM.      LOS: 7 days    Makaylyn Sinyard 10/04/2012, 9:18 AM Pager: [phone]

## 2012-10-05 LAB — CBC
HCT: 34.5 % — ABNORMAL LOW (ref 39.0–52.0)
MCHC: 33.6 g/dL (ref 30.0–36.0)
Platelets: 194 10*3/uL (ref 150–400)
RDW: 12.6 % (ref 11.5–15.5)

## 2012-10-05 MED ORDER — POLYETHYLENE GLYCOL 3350 17 G PO PACK
17.0000 g | PACK | Freq: Two times a day (BID) | ORAL | Status: DC | PRN
  Filled 2012-10-05: qty 1

## 2012-10-05 MED ORDER — ASPIRIN 81 MG PO CHEW
81.0000 mg | CHEWABLE_TABLET | ORAL | Status: DC
  Administered 2012-10-05 – 2012-10-06 (×2): 81 mg via ORAL
  Filled 2012-10-05 (×3): qty 1

## 2012-10-05 MED ORDER — OXYCODONE-ACETAMINOPHEN 5-325 MG PO TABS
1.0000 | ORAL_TABLET | ORAL | Status: DC | PRN
  Administered 2012-10-05 – 2012-10-06 (×4): 1 via ORAL
  Administered 2012-10-07 (×2): 2 via ORAL
  Filled 2012-10-05 (×2): qty 1
  Filled 2012-10-05 (×2): qty 2
  Filled 2012-10-05 (×2): qty 1

## 2012-10-05 NOTE — Progress Notes (Signed)
[facility]    Subjective: C/o being thirsty   Antibiotics:  Anti-infectives   Start     Dose/Rate Route Frequency Ordered Stop   09/30/12 1600  piperacillin-tazobactam (ZOSYN) IVPB 3.375 g     3.375 g 12.5 mL/hr over 240 Minutes Intravenous Every 8 hours 09/30/12 1447     09/28/12 0900  ciprofloxacin (CIPRO) IVPB 400 mg  Status:  Discontinued     400 mg 200 mL/hr over 60 Minutes Intravenous Every 12 hours 09/28/12 0742 09/30/12 1447   09/28/12 0800  metroNIDAZOLE (FLAGYL) IVPB 500 mg  Status:  Discontinued     500 mg 100 mL/hr over 60 Minutes Intravenous Every 8 hours 09/28/12 0742 09/30/12 1447   09/27/12 2300  efavirenz-emtricitabine-tenofovir (ATRIPLA) 600-200-300 MG per tablet 1 tablet     1 tablet Oral Daily at bedtime 09/27/12 2210        Medications: Scheduled Meds: . aspirin  81 mg Oral Q24H  . atorvastatin  20 mg Oral Daily  . efavirenz-emtricitabine-tenofovir  1 tablet Oral QHS  . heparin subcutaneous  5,000 Units Subcutaneous Q8H  . lip balm  1 application Topical BID  . niacin  2,000 mg Oral QHS  . pantoprazole  40 mg Oral Daily  . piperacillin-tazobactam (ZOSYN)  IV  3.375 g Intravenous Q8H  . saccharomyces boulardii  250 mg Oral BID  . sodium chloride  3 mL Intravenous Q12H   Continuous Infusions: . sodium chloride 50 mL/hr at 10/04/12 2021   PRN Meds:.acetaminophen, alum & mag hydroxide-simeth, diphenhydrAMINE, HYDROmorphone (DILAUDID) injection, magic mouthwash, ondansetron (ZOFRAN) IV, ondansetron, oxyCODONE-acetaminophen, polyethylene glycol   Objective: Weight change:   Intake/Output Summary (Last 24 hours) at 10/05/12 1628 Last data filed at 10/05/12 1037  Gross per 24 hour  Intake    885 ml  Output   2205 ml  Net  -1320 ml   Blood pressure 143/75, pulse 93, temperature 98.3 F (36.8 C), temperature source Oral, resp. rate 20, height 5\' 9"  (1.753 m), weight 191 lb 5.8 oz (86.8 kg), SpO2 98.00%. Temp:  [97.1 F  (36.2 C)-98.3 F (36.8 C)] 98.3 F (36.8 C) (06/30 1340) Pulse Rate:  [79-102] 93 (06/30 1340) Resp:  [18-20] 20 (06/30 1340) BP: (141-151)/(75-85) 143/75 mmHg (06/30 1340) SpO2:  [92 %-98 %] 98 % (06/30 1340)  Physical Exam: General: Alert and awake, oriented x3, not in any acute distress. HEENT: anicteric sclera, pupils reactive to light and accommodation, EOMI CVS regular rate, normal r,  no murmur rubs or gallops Chest: clear to auscultation Neuro: nonfocal  Lab Results:  Recent Labs  10/03/12 0545 10/05/12 0505  WBC 10.3 9.2  HGB 11.3* 11.6*  HCT 34.0* 34.5*  PLT 142* 194    BMET  Recent Labs  10/03/12 0545  NA 134*  K 3.2*  CL 97  CO2 30  GLUCOSE 130*  BUN 4*  CREATININE 0.72  CALCIUM 8.1*    Micro Results: Recent Results (from the past 240 hour(s))  CULTURE, BLOOD (ROUTINE X 2)     Status: None   Collection Time    09/28/12  1:15 AM      Result Value Range Status   Specimen Description BLOOD RIGHT ARM   Final   Special Requests BOTTLES DRAWN AEROBIC AND ANAEROBIC    Final   Culture  Setup Time 09/28/2012 09:30   Final   Culture NO GROWTH 5 DAYS   Final   Report Status 10/04/2012 FINAL   Final  CULTURE, BLOOD (ROUTINE X 2)     Status: None   Collection Time    09/28/12  1:22 AM      Result Value Range Status   Specimen Description BLOOD RIGHT ARM   Final   Special Requests BOTTLES DRAWN AEROBIC AND ANAEROBIC    Final   Culture  Setup Time 09/28/2012 09:30   Final   Culture NO GROWTH 5 DAYS   Final   Report Status 10/04/2012 FINAL   Final  CLOSTRIDIUM DIFFICILE BY PCR     Status: None   Collection Time    09/28/12  1:59 PM      Result Value Range Status   C difficile by pcr NEGATIVE  NEGATIVE Final  SURGICAL PCR SCREEN     Status: None   Collection Time    09/30/12 10:00 AM      Result Value Range Status   MRSA, PCR NEGATIVE  NEGATIVE Final   Staphylococcus aureus NEGATIVE  NEGATIVE Final   Comment:            The Xpert SA Assay  (FDA     approved for NASAL specimens     in patients over 53 years of age),     is one component of     a comprehensive surveillance     program.  Test performance has     been validated by [employer] for patients greater     than or equal to 34 year old.     It is not intended     to diagnose infection nor to     guide or monitor treatment.  URINE CULTURE     Status: None   Collection Time    09/30/12  1:00 PM      Result Value Range Status   Specimen Description URINE, CLEAN CATCH   Final   Special Requests NONE   Final   Culture  Setup Time 10/01/2012 01:36   Final   Colony Count NO GROWTH   Final   Culture NO GROWTH   Final   Report Status 10/01/2012 FINAL   Final    Studies/Results: No results found.    Assessment/Plan: Russell Cooper is a 42 y.o. male with  Severe appendicitis sp emergent surgery, also with HIV (superbly well controlled)  #1 Appendicitis: sp Laparscopic appendectomy,  Currently on zosyn approx day 6, would be reasonable to change over to augmentin and finish 10 days total  # 2 HIV: has always been perfectly controlled, will check HIV RNA in am (cd4 could always be lower than would be expected due to acute illness so wont check it)   LOS: 8 days   Acey Lav 10/05/2012, 4:28 PM

## 2012-10-05 NOTE — Progress Notes (Signed)
[facility] PROGRESS NOTE  [facility] WGN:[id number] DOB: July 25, 1970 DOA: 09/27/2012 PCP: Acey Lav, MD  Assessment/Plan:  1. Query appendicitis:  - General surgery on board - Patient is s/p day 5 appendectomy - will f/u on d/c recommendations once cleared by surgery  2. Fever:  - Resolved - Continue with current antibiotic and antiretroviral medication.  3. HIV Infection:  - Per ID - will follow up with their recommendations.  4. GERD: - Stable on IV Protonix  5. Dyslipidemia:  - Place on lipitor with improved po intake.  Code Status: full Family Communication: no family at bedside.  Disposition Plan: If improved oral intake likely d/c next am.   Consultants:  ID. Dr. Orvan Falconer  General surgery Dr. Donell Beers  Procedures:  S/p day 5 appendectomy  Antibiotics:  On Zosyn  Antivirals Atripla  HPI/Subjective: Patient has no new complaints at this moment.  Objective: Filed Vitals:   10/04/12 0545 10/04/12 1316 10/04/12 2134 10/05/12 0508  BP: 121/89 155/83 151/83 141/85  Pulse: 82 73 102 79  Temp: 99.4 F (37.4 C) 99 F (37.2 C) 98 F (36.7 C) 97.1 F (36.2 C)  TempSrc: Oral Oral Oral Oral  Resp: 18 18 18 20   Height:      Weight:      SpO2: 92% 97% 98% 92%    Intake/Output Summary (Last 24 hours) at 10/05/12 1240 Last data filed at 10/05/12 1037  Gross per 24 hour  Intake    985 ml  Output   3005 ml  Net  -2020 ml   Filed Weights   09/27/12 1605 09/27/12 2347  Weight: 86.183 kg (190 lb) 86.8 kg (191 lb 5.8 oz)    Exam:   General:  Pt in NAD, Alert and Awake  Cardiovascular: RRR, no rubs or gallops  Respiratory: CTA BL, no wheezes  Abdomen: Non distended  Musculoskeletal: no cyanosis or clubbing   Data Reviewed: Basic Metabolic Panel:  Recent Labs Lab 09/29/12 0515 09/30/12 0500 10/01/12 0106 10/03/12 0545  NA 133* 131* 132* 134*  K 3.3* 3.3* 3.3* 3.2*  CL 100 99 96 97  CO2 26 27 28 30   GLUCOSE 154*  142* 142* 130*  BUN 6 6 5* 4*  CREATININE 0.85 0.79 0.81 0.72  CALCIUM 7.9* 8.1* 7.7* 8.1*   Liver Function Tests:  Recent Labs Lab 09/29/12 0515  AST 12  ALT 15  ALKPHOS 41  BILITOT 0.9  PROT 5.9*  ALBUMIN 2.7*   No results found for this basename: LIPASE, AMYLASE,  in the last 168 hours No results found for this basename: AMMONIA,  in the last 168 hours CBC:  Recent Labs Lab 09/29/12 0515 09/30/12 0500 10/01/12 0106 10/03/12 0545 10/05/12 0505  WBC 14.0* 13.6* 11.0* 10.3 9.2  NEUTROABS  --   --  9.1*  --   --   HGB 12.1* 12.7* 12.8* 11.3* 11.6*  HCT 36.2* 37.8* 37.6* 34.0* 34.5*  MCV 94.5 94.3 94.2 94.4 93.0  PLT 78* 92* 103* 142* 194   Cardiac Enzymes: No results found for this basename: CKTOTAL, CKMB, CKMBINDEX, TROPONINI,  in the last 168 hours BNP (last 3 results) No results found for this basename: PROBNP,  in the last 8760 hours CBG:  Recent Labs Lab 09/30/12 0726  GLUCAP 135*    Recent Results (from the past 240 hour(s))  CULTURE, BLOOD (ROUTINE X 2)     Status: None   Collection Time    09/28/12  1:15 AM  Result Value Range Status   Specimen Description BLOOD RIGHT ARM   Final   Special Requests BOTTLES DRAWN AEROBIC AND ANAEROBIC    Final   Culture  Setup Time 09/28/2012 09:30   Final   Culture NO GROWTH 5 DAYS   Final   Report Status 10/04/2012 FINAL   Final  CULTURE, BLOOD (ROUTINE X 2)     Status: None   Collection Time    09/28/12  1:22 AM      Result Value Range Status   Specimen Description BLOOD RIGHT ARM   Final   Special Requests BOTTLES DRAWN AEROBIC AND ANAEROBIC    Final   Culture  Setup Time 09/28/2012 09:30   Final   Culture NO GROWTH 5 DAYS   Final   Report Status 10/04/2012 FINAL   Final  CLOSTRIDIUM DIFFICILE BY PCR     Status: None   Collection Time    09/28/12  1:59 PM      Result Value Range Status   C difficile by pcr NEGATIVE  NEGATIVE Final  SURGICAL PCR SCREEN     Status: None   Collection Time     09/30/12 10:00 AM      Result Value Range Status   MRSA, PCR NEGATIVE  NEGATIVE Final   Staphylococcus aureus NEGATIVE  NEGATIVE Final   Comment:            The Xpert SA Assay (FDA     approved for NASAL specimens     in patients over 43 years of age),     is one component of     a comprehensive surveillance     program.  Test performance has     been validated by [employer] for patients greater     than or equal to 102 year old.     It is not intended     to diagnose infection nor to     guide or monitor treatment.  URINE CULTURE     Status: None   Collection Time    09/30/12  1:00 PM      Result Value Range Status   Specimen Description URINE, CLEAN CATCH   Final   Special Requests NONE   Final   Culture  Setup Time 10/01/2012 01:36   Final   Colony Count NO GROWTH   Final   Culture NO GROWTH   Final   Report Status 10/01/2012 FINAL   Final     Studies: No results found.  Scheduled Meds: . aspirin  81 mg Oral Q24H  . atorvastatin  20 mg Oral Daily  . efavirenz-emtricitabine-tenofovir  1 tablet Oral QHS  . heparin subcutaneous  5,000 Units Subcutaneous Q8H  . lip balm  1 application Topical BID  . niacin  2,000 mg Oral QHS  . pantoprazole  40 mg Oral Daily  . piperacillin-tazobactam (ZOSYN)  IV  3.375 g Intravenous Q8H  . saccharomyces boulardii  250 mg Oral BID  . sodium chloride  3 mL Intravenous Q12H   Continuous Infusions: . sodium chloride 50 mL/hr at 10/04/12 2021    Principal Problem:   Fever Active Problems:   HIV INFECTION   GERD   Nausea & vomiting   Abdominal pain, bilateral lower quadrant   Diarrhea   Hyperglycemia   Hypokalemia   Appendicitis, acute, with peritonitis s/p lap appy June 2014    Time spent: > 35 minutes    Marinus Eicher, [employer]  [facility] Pager [phone]. If 7PM-7AM, please contact night-coverage at [email or web address], password [facility] 10/05/2012, 12:40 PM  LOS: 8 days

## 2012-10-05 NOTE — Progress Notes (Signed)
5 Days Post-Op  Subjective: He feels pretty good, tolerating a regular diet, started last PM.  No real BM but some gas and some loose liquid from his rectum so far.  He is flushing but it is after Nispan.   Objective: Vital signs in last 24 hours: Temp:  [97.1 F (36.2 C)-99 F (37.2 C)] 97.1 F (36.2 C) (06/30 0508) Pulse Rate:  [73-102] 79 (06/30 0508) Resp:  [18-20] 20 (06/30 0508) BP: (141-155)/(83-85) 141/85 mmHg (06/30 0508) SpO2:  [92 %-98 %] 92 % (06/30 0508) Last BM Date: 10/04/12 Nothing  PO recorded yesterday, no BM recorded. Afebrile, BP diastolic is up some CBC shows normal WBC, H/H is stable CXR yesterday shows bilat atelectasis or infiltrate, no change from last study Diet: regular listed as tarting yesterday afternoon On Zosyn since 6/25 IV Intake/Output from previous day: 06/29 0701 - 06/30 0700 In: 695 [I.V.:695] Out: 2715 [Urine:2700; Drains:15] Intake/Output this shift: Total I/O In: 685 [I.V.:675; Other:10] Out: 600 [Urine:600]  General appearance: alert, cooperative, no distress and flushed skin. Resp: clear to auscultation bilaterally GI: soft, sore, incisions look fine, He has some redness from web dressing,(possibly the adhesive) +BS, + wet flatus,.  Drain is clear serous fluid.  Lab Results:   Recent Labs  10/03/12 0545 10/05/12 0505  WBC 10.3 9.2  HGB 11.3* 11.6*  HCT 34.0* 34.5*  PLT 142* 194    BMET  Recent Labs  10/03/12 0545  NA 134*  K 3.2*  CL 97  CO2 30  GLUCOSE 130*  BUN 4*  CREATININE 0.72  CALCIUM 8.1*   PT/INR No results found for this basename: LABPROT, INR,  in the last 72 hours   Recent Labs Lab 09/29/12 0515  AST 12  ALT 15  ALKPHOS 41  BILITOT 0.9  PROT 5.9*  ALBUMIN 2.7*     Lipase     Component Value Date/Time   LIPASE 15 09/27/2012 1715     Studies/Results: Dg Chest 2 View  10/03/2012   *RADIOLOGY REPORT*  Clinical Data: Status post appendectomy.  CHEST - 2 VIEW  Comparison: 10/03/2012 at  09:48 hours  Findings: No significant interval change.  Patchy right perihilar, right basilar and left basilar opacities.  No effusions or pneumothoraces.  Heart and mediastinal structures appear normal.  IMPRESSION: The bilateral opacities which may represent changes due to atelectasis and/or infiltrate.  No significant interval change from the prior study.   Original Report Authenticated By: Sander Radon, M.D.   [facility] 1 View  10/03/2012   *RADIOLOGY REPORT*  Clinical Data: Hypoxia, fever, postop appendectomy.  PORTABLE CHEST - 1 VIEW  Comparison: 02/06/2008.  Findings: Semi lordotic projection.  The heart and mediastinal contours are normal.  Hazy interstitial changes are present in each right perihilar region.  Linear atelectasis overlies the left heart border.  There is slightly greater opacity behind the heart obscuring the margin of the left hemidiaphragm.  The lungs otherwise appear clear.  There are no effusions or pneumothoraces. There is prominence of the small bowel loops in the left upper quadrant of the abdomen.  IMPRESSION: The changes in the lungs may be due to atelectasis alone. Postoperative pneumonia, especially in the left base, should also be considered clinically.  The patient does have some prominent soft small bowel loops likely related to ileus.   Original Report Authenticated By: Sander Radon, M.D.    Medications: . atorvastatin  20 mg Oral Daily  . efavirenz-emtricitabine-tenofovir  1 tablet Oral QHS  .  heparin subcutaneous  5,000 Units Subcutaneous Q8H  . lip balm  1 application Topical BID  . niacin  2,000 mg Oral QHS  . pantoprazole  40 mg Oral Daily  . piperacillin-tazobactam (ZOSYN)  IV  3.375 g Intravenous Q8H  . saccharomyces boulardii  250 mg Oral BID  . sodium chloride  3 mL Intravenous Q12H   Prior to Admission medications   Medication Sig Start Date End Date Taking? Authorizing Provider  aspirin 81 MG tablet Take 81 mg by mouth daily.    Yes  Historical Provider, MD  atorvastatin (LIPITOR) 20 MG tablet Take 1 tablet (20 mg total) by mouth daily. 06/18/12  Yes Randall Hiss, MD  ATRIPLA 600-200-300 MG per tablet TAKE 1 TABLET BY MOUTH AT BEDTIME 01/14/12  Yes Randall Hiss, MD  niacin (NIASPAN) 1000 MG CR tablet Take 2 tablets (2,000 mg total) by mouth at bedtime. 02/27/12  Yes Randall Hiss, MD  Omega-3 Fatty Acids (FISH OIL) 1200 MG CAPS Take 3,600 mg by mouth daily.     Yes Historical Provider, MD  omeprazole (PRILOSEC) 20 MG capsule Take 1 capsule (20 mg total) by mouth daily. 10/07/11  Yes Randall Hiss, MD   . sodium chloride 50 mL/hr at 10/04/12 2021    Assessment/Plan Acute Gangrenous Appendicitis, with diffusely inflamed bowel.  No abscess, but suppurative fluid in RLQ. S/p Laparoscopic assisted appendectomy with open right upper quadrant incision to close cecal defect. 09/30/2012, Almond Lint, MD  HIV (human immunodeficiency virus infection)  Hypercholesteremia     Plan:  I will hep lock his IV, start him on PO pain meds, d/c O2 sat, and mobilize.  I will discuss changing him over to PO antibiotics and discuss length of time on oral antibiotics, when to remove drain. Recheck labs, last K+ was 3.2.      LOS: 8 days    Alycen Mack 10/05/2012

## 2012-10-05 NOTE — Progress Notes (Signed)
Patient seen and examined.  Progressing well.

## 2012-10-05 NOTE — Progress Notes (Signed)
Patient had scant amount of emesis with food particles yellow and whitish in color, informed patient he may need to try and just drink liquids for the rest of the nigh Dr notified, patient in stable condition at this time

## 2012-10-05 NOTE — Progress Notes (Signed)
Pt's IV out of date. RN from the shift before tried to reinsert IV and pt refused because he is going home tomorrow. (Note in Doc flow sheets). Pt aware and informed about his IV being out of date. Pt is set to be discharged tomorrow.

## 2012-10-06 ENCOUNTER — Inpatient Hospital Stay (HOSPITAL_COMMUNITY): Payer: BC Managed Care – PPO

## 2012-10-06 ENCOUNTER — Encounter (HOSPITAL_COMMUNITY): Payer: Self-pay | Admitting: Radiology

## 2012-10-06 DIAGNOSIS — K651 Peritoneal abscess: Secondary | ICD-10-CM

## 2012-10-06 DIAGNOSIS — T8140XA Infection following a procedure, unspecified, initial encounter: Secondary | ICD-10-CM

## 2012-10-06 DIAGNOSIS — D72829 Elevated white blood cell count, unspecified: Secondary | ICD-10-CM

## 2012-10-06 DIAGNOSIS — E876 Hypokalemia: Secondary | ICD-10-CM

## 2012-10-06 LAB — MAGNESIUM: Magnesium: 2.2 mg/dL (ref 1.5–2.5)

## 2012-10-06 LAB — CBC
MCH: 32 pg (ref 26.0–34.0)
MCHC: 34.3 g/dL (ref 30.0–36.0)
Platelets: 250 10*3/uL (ref 150–400)
RBC: 3.88 MIL/uL — ABNORMAL LOW (ref 4.22–5.81)

## 2012-10-06 LAB — BASIC METABOLIC PANEL
Calcium: 8.1 mg/dL — ABNORMAL LOW (ref 8.4–10.5)
GFR calc non Af Amer: >90 mL/min (ref >90)
Potassium: 3 mEq/L — ABNORMAL LOW (ref 3.5–5.1)
Sodium: 135 mEq/L (ref 135–145)

## 2012-10-06 MED ORDER — POTASSIUM CHLORIDE CRYS ER 20 MEQ PO TBCR
30.0000 meq | EXTENDED_RELEASE_TABLET | Freq: Three times a day (TID) | ORAL | Status: DC
  Administered 2012-10-06 (×2): 30 meq via ORAL
  Filled 2012-10-06 (×7): qty 1

## 2012-10-06 MED ORDER — IOHEXOL 300 MG/ML  SOLN
100.0000 mL | Freq: Once | INTRAMUSCULAR | Status: AC | PRN
  Administered 2012-10-06: 100 mL via INTRAVENOUS

## 2012-10-06 MED ORDER — IOHEXOL 300 MG/ML  SOLN
25.0000 mL | INTRAMUSCULAR | Status: AC
  Administered 2012-10-06 (×2): 25 mL via ORAL

## 2012-10-06 NOTE — Progress Notes (Signed)
6 Days Post-Op  Subject: He says he feels better today.  No BM, just some loose liquid with flatus.  Pain is less today.  Objective: Vital signs in last 24 hours: Temp:  [98.2 F (36.8 C)-98.7 F (37.1 C)] 98.2 F (36.8 C) (07/01 0615) Pulse Rate:  [72-98] 72 (07/01 0615) Resp:  [18-20] 18 (07/01 0615) BP: (123-143)/(75-79) 123/79 mmHg (07/01 0615) SpO2:  [93 %-98 %] 93 % (07/01 0615) Last BM Date: 10/04/12 Nothing from drain recorded, Afebrile, VSS WBC is up, K+ 3.0 Intake/Output from previous day: 06/30 0701 - 07/01 0700 In: 1960 [I.V.:1850; IV Piggyback:100] Out: 1950 [Urine:1950] Intake/Output this shift:    General appearance: alert, cooperative and no distress Resp: clear to auscultation bilaterally GI: soft, non-tender; bowel sounds normal; no masses,  no organomegaly and incisions look fine, drain is clear serous appearing.   Lab Results:   Recent Labs  10/05/12 0505 10/06/12 0515  WBC 9.2 12.0*  HGB 11.6* 12.4*  HCT 34.5* 36.1*  PLT 194 250    BMET  Recent Labs  10/06/12 0515  NA 135  K 3.0*  CL 98  CO2 30  GLUCOSE 124*  BUN 4*  CREATININE 0.76  CALCIUM 8.1*   PT/INR No results found for this basename: LABPROT, INR,  in the last 72 hours  No results found for this basename: AST, ALT, ALKPHOS, BILITOT, PROT, ALBUMIN,  in the last 168 hours   Lipase     Component Value Date/Time   LIPASE 15 09/27/2012 1715     Studies/Results: No results found.  Medications: . aspirin  81 mg Oral Q24H  . atorvastatin  20 mg Oral Daily  . efavirenz-emtricitabine-tenofovir  1 tablet Oral QHS  . heparin subcutaneous  5,000 Units Subcutaneous Q8H  . lip balm  1 application Topical BID  . niacin  2,000 mg Oral QHS  . pantoprazole  40 mg Oral Daily  . piperacillin-tazobactam (ZOSYN)  IV  3.375 g Intravenous Q8H  . saccharomyces boulardii  250 mg Oral BID  . sodium chloride  3 mL Intravenous Q12H    Assessment/Plan Acute Gangrenous Appendicitis, with  diffusely inflamed bowel. No abscess, but suppurative fluid in RLQ.  S/p Laparoscopic assisted appendectomy with open right upper quadrant incision to close cecal defect. 09/30/2012, Almond Lint, MD  Hypokalemia Elevated WBC HIV (human immunodeficiency virus infection)  Hypercholesteremia    Plan:  Replace K+, recheck CT scan, labs ordered for AM.  He's disappointed he isn't going home, I said we would see after CT scan is done.    LOS: 9 days    Kieana Livesay 10/06/2012

## 2012-10-06 NOTE — Progress Notes (Signed)
[facility]    Subjective: No new complaints   Antibiotics:  Anti-infectives   Start     Dose/Rate Route Frequency Ordered Stop   09/30/12 1600  piperacillin-tazobactam (ZOSYN) IVPB 3.375 g     3.375 g 12.5 mL/hr over 240 Minutes Intravenous Every 8 hours 09/30/12 1447     09/28/12 0900  ciprofloxacin (CIPRO) IVPB 400 mg  Status:  Discontinued     400 mg 200 mL/hr over 60 Minutes Intravenous Every 12 hours 09/28/12 0742 09/30/12 1447   09/28/12 0800  metroNIDAZOLE (FLAGYL) IVPB 500 mg  Status:  Discontinued     500 mg 100 mL/hr over 60 Minutes Intravenous Every 8 hours 09/28/12 0742 09/30/12 1447   09/27/12 2300  efavirenz-emtricitabine-tenofovir (ATRIPLA) 600-200-300 MG per tablet 1 tablet     1 tablet Oral Daily at bedtime 09/27/12 2210        Medications: Scheduled Meds: . aspirin  81 mg Oral Q24H  . atorvastatin  20 mg Oral Daily  . efavirenz-emtricitabine-tenofovir  1 tablet Oral QHS  . heparin subcutaneous  5,000 Units Subcutaneous Q8H  . lip balm  1 application Topical BID  . niacin  2,000 mg Oral QHS  . pantoprazole  40 mg Oral Daily  . piperacillin-tazobactam (ZOSYN)  IV  3.375 g Intravenous Q8H  . potassium chloride  30 mEq Oral TID WC  . saccharomyces boulardii  250 mg Oral BID  . sodium chloride  3 mL Intravenous Q12H   Continuous Infusions:   PRN Meds:.acetaminophen, alum & mag hydroxide-simeth, diphenhydrAMINE, HYDROmorphone (DILAUDID) injection, magic mouthwash, ondansetron (ZOFRAN) IV, ondansetron, oxyCODONE-acetaminophen, polyethylene glycol   Objective: Weight change:   Intake/Output Summary (Last 24 hours) at 10/06/12 1656 Last data filed at 10/06/12 1357  Gross per 24 hour  Intake   1675 ml  Output   1360 ml  Net    315 ml   Blood pressure 135/79, pulse 93, temperature 98.5 F (36.9 C), temperature source Oral, resp. rate 20, height 5\' 9"  (1.753 m), weight 191 lb 5.8 oz (86.8 kg), SpO2 95.00%. Temp:  [98.2 F  (36.8 C)-98.7 F (37.1 C)] 98.5 F (36.9 C) (07/01 1318) Pulse Rate:  [72-98] 93 (07/01 1318) Resp:  [18-20] 20 (07/01 1318) BP: (123-136)/(79) 135/79 mmHg (07/01 1318) SpO2:  [93 %-95 %] 95 % (07/01 1318)  Physical Exam: General: Alert and awake, oriented x3, not in any acute distress. HEENT: anicteric sclera, pupils reactive to light and accommodation, EOMI CVS regular rate, normal r,  no murmur rubs or gallops Chest: clear to auscultation GI: JP drain with serosanguinous material Neuro: nonfocal  Lab Results:  Recent Labs  10/05/12 0505 10/06/12 0515  WBC 9.2 12.0*  HGB 11.6* 12.4*  HCT 34.5* 36.1*  PLT 194 250    BMET  Recent Labs  10/06/12 0515  NA 135  K 3.0*  CL 98  CO2 30  GLUCOSE 124*  BUN 4*  CREATININE 0.76  CALCIUM 8.1*    Micro Results: Recent Results (from the past 240 hour(s))  CULTURE, BLOOD (ROUTINE X 2)     Status: None   Collection Time    09/28/12  1:15 AM      Result Value Range Status   Specimen Description BLOOD RIGHT ARM   Final   Special Requests BOTTLES DRAWN AEROBIC AND ANAEROBIC    Final   Culture  Setup Time 09/28/2012 09:30   Final   Culture NO GROWTH 5 DAYS   Final  Report Status 10/04/2012 FINAL   Final  CULTURE, BLOOD (ROUTINE X 2)     Status: None   Collection Time    09/28/12  1:22 AM      Result Value Range Status   Specimen Description BLOOD RIGHT ARM   Final   Special Requests BOTTLES DRAWN AEROBIC AND ANAEROBIC    Final   Culture  Setup Time 09/28/2012 09:30   Final   Culture NO GROWTH 5 DAYS   Final   Report Status 10/04/2012 FINAL   Final  CLOSTRIDIUM DIFFICILE BY PCR     Status: None   Collection Time    09/28/12  1:59 PM      Result Value Range Status   C difficile by pcr NEGATIVE  NEGATIVE Final  SURGICAL PCR SCREEN     Status: None   Collection Time    09/30/12 10:00 AM      Result Value Range Status   MRSA, PCR NEGATIVE  NEGATIVE Final   Staphylococcus aureus NEGATIVE  NEGATIVE Final    Comment:            The Xpert SA Assay (FDA     approved for NASAL specimens     in patients over 72 years of age),     is one component of     a comprehensive surveillance     program.  Test performance has     been validated by [employer] for patients greater     than or equal to 1 year old.     It is not intended     to diagnose infection nor to     guide or monitor treatment.  URINE CULTURE     Status: None   Collection Time    09/30/12  1:00 PM      Result Value Range Status   Specimen Description URINE, CLEAN CATCH   Final   Special Requests NONE   Final   Culture  Setup Time 10/01/2012 01:36   Final   Colony Count NO GROWTH   Final   Culture NO GROWTH   Final   Report Status 10/01/2012 FINAL   Final    Studies/Results: Ct Abdomen Pelvis W Contrast  10/06/2012   *RADIOLOGY REPORT*  Clinical Data: Post appendectomy 6 days ago, leukocytosis, history HIV  CT ABDOMEN AND PELVIS WITH CONTRAST  Technique:  Multidetector CT imaging of the abdomen and pelvis was performed following the standard protocol during bolus administration of intravenous contrast. Sagittal and coronal MPR images reconstructed from axial data set.  Contrast: OMNIPAQUE IOHEXOL 300 MG/ML  SOLN Dilute oral contrast.  Comparison: 09/29/2012  Findings: Small bibasilar pleural effusions with atelectasis in both lower lobes. Small right renal cyst. Liver, spleen, pancreas, kidneys, and adrenal glands otherwise normal. Surgical drain identified in right lower quadrant extending to sub hepatic space. Interval appendectomy. Scattered stranding of tissue planes in right pelvis/right lower quadrant identified.  Small focal fluid collection is identified in the right lower quadrant, 2.5 x 2.5 x 1.8 cm, could represent a sterile or infected postoperative collection. Additional tiny lenticular fluid collection in right upper pelvis, slightly more inferior to the larger collection may represent an adjacent versus  contiguous postoperative fluid collection, 17 x 9 x 11 mm. Small dependent fluid collection in pelvis between bladder and rectum, with questionable enhancing margins, 3.6 x 1.6 1.4 cm, cannot exclude additional abscess though this could represent dependent free pelvic fluid, extending slightly  to the left.  Thickened distal small bowel loops compatible with inflammatory process and surgery. Small lipoma within left gluteus minimus muscle. Dilated small bowel loops with decompressed colon question postoperative ileus. Unremarkable bladder and ureters. Diverticulosis of sigmoid colon. No mass, adenopathy, free air, hernia, or acute bony lesion.  IMPRESSION: Post appendectomy. Small fluid collections in pelvis could represent sterile or infected postoperative collections, including a right pelvic collection 2.5 cm in greatest size and a dependent pelvic collection measuring 3.6 cm greatest size. Suspect postoperative ileus. Bibasilar pleural effusions atelectasis.   Original Report Authenticated By: Ulyses Southward, M.D.      Assessment/Plan: Russell Cooper is a 42 y.o. male with  Severe appendicitis sp emergent surgery, also with HIV (superbly well controlled)  #1 Appendicitis: sp Laparscopic appendectomy, now with small fluid collections seen on CT scan.  --consider IR guided drainage of the larger fluid collections and send for culture --otherwise if he continues to look well clinically would change to oral augmetin 875/125 bid and continue this until repeat CT scan in another 2+weeks shows resolution of these collections    # 2 HIV: has always been perfectly controlled, HIV RNA pending  LOS: 9 days   Acey Lav 10/06/2012, 4:56 PM

## 2012-10-06 NOTE — Progress Notes (Signed)
Patient seen and examined.  Will increase in WBC need to check CT to evaluate for a post op abscess.

## 2012-10-06 NOTE — Progress Notes (Signed)
[facility] PROGRESS NOTE  Russell Cooper EAV:[id number] DOB: 1971-01-28 DOA: 09/27/2012 PCP: Acey Lav, MD  Brief Narrative: Patient is a 42 y/o with history of HIV on atripla.  Who presented to the ED complaining of fevers and abdominal pain.  After further evaluation was found to have appendicitis and general surgery performed a Lap appendectomy. ID was consulted initially for management of HIV  Assessment/Plan:  1. Appendicitis:  - General surgery on board - Patient is s/p day 6 appendectomy - General surgery to evaluate with CT of abdomen for abscess given elevation of WBC after recent Lap appy and while on IV abx Zosyn - will f/u on d/c recommendations once cleared by surgery  2. Fever:  - Resolved - Continue with current antibiotic and antiretroviral medication. - ?Leukocytosis.  General surgery currently obtaining further imaging study for further evaluation and recommendations.  3. HIV Infection:  - Per ID - will follow up with their recommendations. - On Atripla  4. GERD: - Stable on IV Protonix  5. Dyslipidemia:  - Place on lipitor with improved po intake.  Code Status: full Family Communication: no family at bedside.  Disposition Plan: If improved oral intake likely d/c next am.   Consultants:  ID. Dr. Orvan Falconer  General surgery Dr. Donell Beers  Procedures:  S/p day 5 appendectomy  Antibiotics:  On Zosyn day 9  Antivirals Atripla  HPI/Subjective: Patient has no new complaints at this moment. No acute issues reported overnight.  Objective: Filed Vitals:   10/05/12 1340 10/05/12 2200 10/06/12 0615 10/06/12 1318  BP: 143/75 136/79 123/79 135/79  Pulse: 93 98 72 93  Temp: 98.3 F (36.8 C) 98.7 F (37.1 C) 98.2 F (36.8 C) 98.5 F (36.9 C)  TempSrc: Oral Oral Oral Oral  Resp: 20 20 18 20   Height:      Weight:      SpO2: 98% 95% 93% 95%    Intake/Output Summary (Last 24 hours) at 10/06/12 1353 Last data filed at 10/06/12  1013  Gross per 24 hour  Intake   1275 ml  Output   1360 ml  Net    -85 ml   Filed Weights   09/27/12 1605 09/27/12 2347  Weight: 86.183 kg (190 lb) 86.8 kg (191 lb 5.8 oz)    Exam:   General:  Pt in NAD, Alert and Awake  Cardiovascular: RRR, no rubs or gallops  Respiratory: CTA BL, no wheezes  Abdomen: Non distended  Musculoskeletal: no cyanosis or clubbing   Data Reviewed: Basic Metabolic Panel:  Recent Labs Lab 09/30/12 0500 10/01/12 0106 10/03/12 0545 10/06/12 0515  NA 131* 132* 134* 135  K 3.3* 3.3* 3.2* 3.0*  CL 99 96 97 98  CO2 27 28 30 30   GLUCOSE 142* 142* 130* 124*  BUN 6 5* 4* 4*  CREATININE 0.79 0.81 0.72 0.76  CALCIUM 8.1* 7.7* 8.1* 8.1*  MG  --   --   --  2.2   Liver Function Tests: No results found for this basename: AST, ALT, ALKPHOS, BILITOT, PROT, ALBUMIN,  in the last 168 hours No results found for this basename: LIPASE, AMYLASE,  in the last 168 hours No results found for this basename: AMMONIA,  in the last 168 hours CBC:  Recent Labs Lab 09/30/12 0500 10/01/12 0106 10/03/12 0545 10/05/12 0505 10/06/12 0515  WBC 13.6* 11.0* 10.3 9.2 12.0*  NEUTROABS  --  9.1*  --   --   --   HGB 12.7* 12.8* 11.3* 11.6*  12.4*  HCT 37.8* 37.6* 34.0* 34.5* 36.1*  MCV 94.3 94.2 94.4 93.0 93.0  PLT 92* 103* 142* 194 250   Cardiac Enzymes: No results found for this basename: CKTOTAL, CKMB, CKMBINDEX, TROPONINI,  in the last 168 hours BNP (last 3 results) No results found for this basename: PROBNP,  in the last 8760 hours CBG:  Recent Labs Lab 09/30/12 0726  GLUCAP 135*    Recent Results (from the past 240 hour(s))  CULTURE, BLOOD (ROUTINE X 2)     Status: None   Collection Time    09/28/12  1:15 AM      Result Value Range Status   Specimen Description BLOOD RIGHT ARM   Final   Special Requests BOTTLES DRAWN AEROBIC AND ANAEROBIC    Final   Culture  Setup Time 09/28/2012 09:30   Final   Culture NO GROWTH 5 DAYS   Final   Report  Status 10/04/2012 FINAL   Final  CULTURE, BLOOD (ROUTINE X 2)     Status: None   Collection Time    09/28/12  1:22 AM      Result Value Range Status   Specimen Description BLOOD RIGHT ARM   Final   Special Requests BOTTLES DRAWN AEROBIC AND ANAEROBIC    Final   Culture  Setup Time 09/28/2012 09:30   Final   Culture NO GROWTH 5 DAYS   Final   Report Status 10/04/2012 FINAL   Final  CLOSTRIDIUM DIFFICILE BY PCR     Status: None   Collection Time    09/28/12  1:59 PM      Result Value Range Status   C difficile by pcr NEGATIVE  NEGATIVE Final  SURGICAL PCR SCREEN     Status: None   Collection Time    09/30/12 10:00 AM      Result Value Range Status   MRSA, PCR NEGATIVE  NEGATIVE Final   Staphylococcus aureus NEGATIVE  NEGATIVE Final   Comment:            The Xpert SA Assay (FDA     approved for NASAL specimens     in patients over 28 years of age),     is one component of     a comprehensive surveillance     program.  Test performance has     been validated by [employer] for patients greater     than or equal to 55 year old.     It is not intended     to diagnose infection nor to     guide or monitor treatment.  URINE CULTURE     Status: None   Collection Time    09/30/12  1:00 PM      Result Value Range Status   Specimen Description URINE, CLEAN CATCH   Final   Special Requests NONE   Final   Culture  Setup Time 10/01/2012 01:36   Final   Colony Count NO GROWTH   Final   Culture NO GROWTH   Final   Report Status 10/01/2012 FINAL   Final     Studies: Ct Abdomen Pelvis W Contrast  10/06/2012   *RADIOLOGY REPORT*  Clinical Data: Post appendectomy 6 days ago, leukocytosis, history HIV  CT ABDOMEN AND PELVIS WITH CONTRAST  Technique:  Multidetector CT imaging of the abdomen and pelvis was performed following the standard protocol during bolus administration of intravenous contrast. Sagittal and coronal MPR images reconstructed from  axial data set.  Contrast:  OMNIPAQUE IOHEXOL 300 MG/ML  SOLN Dilute oral contrast.  Comparison: 09/29/2012  Findings: Small bibasilar pleural effusions with atelectasis in both lower lobes. Small right renal cyst. Liver, spleen, pancreas, kidneys, and adrenal glands otherwise normal. Surgical drain identified in right lower quadrant extending to sub hepatic space. Interval appendectomy. Scattered stranding of tissue planes in right pelvis/right lower quadrant identified.  Small focal fluid collection is identified in the right lower quadrant, 2.5 x 2.5 x 1.8 cm, could represent a sterile or infected postoperative collection. Additional tiny lenticular fluid collection in right upper pelvis, slightly more inferior to the larger collection may represent an adjacent versus contiguous postoperative fluid collection, 17 x 9 x 11 mm. Small dependent fluid collection in pelvis between bladder and rectum, with questionable enhancing margins, 3.6 x 1.6 1.4 cm, cannot exclude additional abscess though this could represent dependent free pelvic fluid, extending slightly to the left.  Thickened distal small bowel loops compatible with inflammatory process and surgery. Small lipoma within left gluteus minimus muscle. Dilated small bowel loops with decompressed colon question postoperative ileus. Unremarkable bladder and ureters. Diverticulosis of sigmoid colon. No mass, adenopathy, free air, hernia, or acute bony lesion.  IMPRESSION: Post appendectomy. Small fluid collections in pelvis could represent sterile or infected postoperative collections, including a right pelvic collection 2.5 cm in greatest size and a dependent pelvic collection measuring 3.6 cm greatest size. Suspect postoperative ileus. Bibasilar pleural effusions atelectasis.   Original Report Authenticated By: Ulyses Southward, M.D.    Scheduled Meds: . aspirin  81 mg Oral Q24H  . atorvastatin  20 mg Oral Daily  . efavirenz-emtricitabine-tenofovir  1 tablet Oral QHS  . heparin subcutaneous   5,000 Units Subcutaneous Q8H  . lip balm  1 application Topical BID  . niacin  2,000 mg Oral QHS  . pantoprazole  40 mg Oral Daily  . piperacillin-tazobactam (ZOSYN)  IV  3.375 g Intravenous Q8H  . potassium chloride  30 mEq Oral TID WC  . saccharomyces boulardii  250 mg Oral BID  . sodium chloride  3 mL Intravenous Q12H   Continuous Infusions:    Principal Problem:   Fever Active Problems:   HIV INFECTION   GERD   Nausea & vomiting   Abdominal pain, bilateral lower quadrant   Diarrhea   Hyperglycemia   Hypokalemia   Appendicitis, acute, with peritonitis s/p lap appy June 2014    Time spent: > 35 minutes    [name]  [facility] Pager [phone]. If 7PM-7AM, please contact night-coverage at [email or web address], password [facility] 10/06/2012, 1:53 PM  LOS: 9 days

## 2012-10-07 ENCOUNTER — Other Ambulatory Visit (INDEPENDENT_AMBULATORY_CARE_PROVIDER_SITE_OTHER): Payer: Self-pay | Admitting: General Surgery

## 2012-10-07 ENCOUNTER — Telehealth (INDEPENDENT_AMBULATORY_CARE_PROVIDER_SITE_OTHER): Payer: Self-pay | Admitting: General Surgery

## 2012-10-07 DIAGNOSIS — E876 Hypokalemia: Secondary | ICD-10-CM

## 2012-10-07 DIAGNOSIS — K3580 Unspecified acute appendicitis: Secondary | ICD-10-CM

## 2012-10-07 DIAGNOSIS — E781 Pure hyperglyceridemia: Secondary | ICD-10-CM

## 2012-10-07 LAB — BASIC METABOLIC PANEL
Calcium: 8.2 mg/dL — ABNORMAL LOW (ref 8.4–10.5)
GFR calc Af Amer: >90 mL/min (ref >90)
GFR calc non Af Amer: >90 mL/min (ref >90)
Sodium: 137 mEq/L (ref 135–145)

## 2012-10-07 LAB — CBC
MCH: 32.1 pg (ref 26.0–34.0)
MCHC: 34.3 g/dL (ref 30.0–36.0)
Platelets: 242 10*3/uL (ref 150–400)
RDW: 12.7 % (ref 11.5–15.5)

## 2012-10-07 LAB — HIV-1 RNA ULTRAQUANT REFLEX TO GENTYP+: HIV 1 RNA Quant: <20 copies/mL (ref <20)

## 2012-10-07 MED ORDER — POTASSIUM CHLORIDE CRYS ER 20 MEQ PO TBCR
40.0000 meq | EXTENDED_RELEASE_TABLET | Freq: Once | ORAL | Status: AC
  Administered 2012-10-07: 40 meq via ORAL
  Filled 2012-10-07: qty 2

## 2012-10-07 MED ORDER — POTASSIUM CHLORIDE ER 20 MEQ PO TBCR: 40.0000 meq | EXTENDED_RELEASE_TABLET | Freq: Two times a day (BID) | ORAL | Status: DC

## 2012-10-07 MED ORDER — OXYCODONE-ACETAMINOPHEN 5-325 MG PO TABS: 2.0000 | ORAL_TABLET | Freq: Four times a day (QID) | ORAL | Status: DC | PRN

## 2012-10-07 MED ORDER — AMOXICILLIN-POT CLAVULANATE 875-125 MG PO TABS
1.0000 | ORAL_TABLET | Freq: Two times a day (BID) | ORAL | Status: DC
  Filled 2012-10-07 (×2): qty 1

## 2012-10-07 MED ORDER — DOCUSATE SODIUM 100 MG PO CAPS
100.0000 mg | ORAL_CAPSULE | Freq: Two times a day (BID) | ORAL | Status: DC | PRN
Start: 2012-10-07 — End: 2012-10-21

## 2012-10-07 MED ORDER — AMOXICILLIN-POT CLAVULANATE 875-125 MG PO TABS: 1.0000 | ORAL_TABLET | Freq: Two times a day (BID) | ORAL | Status: DC

## 2012-10-07 NOTE — Telephone Encounter (Signed)
Called patient did not LVM will call back with appt  At [address] 10/19/12 1 pm patient needs to pickup contrast

## 2012-10-07 NOTE — Discharge Summary (Signed)
Physician Discharge Summary  Russell Cooper:[id number] DOB: 1970/12/21 DOA: 09/27/2012  PCP: Acey Lav, MD  Admit date: 09/27/2012 Discharge date: 10/07/2012  Time spent: 40  minutes  Recommendations for Outpatient Follow-up:  Home with outpatient follow up with PCP and Gen. surgery in 2 weeks. Patient will need a followup CT scan to evaluate resolution of the fluid collection in the right pelvis. -please check  k level during outpt follow up  Discharge Diagnoses:   Principal Problem:   Appendicitis, acute, with peritonitis s/p lap appendectomy  Active Problems:   HIV INFECTION   GERD   Hypokalemia Hyperlipidemia   Discharge Condition: Fair  Diet recommendation: Low-cholesterol  Filed Weights   09/27/12 1605 09/27/12 2347  Weight: 86.183 kg (190 lb) 86.8 kg (191 lb 5.8 oz)    History of present illness:  Please refer to admission H&P for details but in brief, 42 y/o male with history of HIV on atripla  presented to the ED complaining of fevers with abdominal pain associated with nausea, vomiting and loose stools. After further evaluation was found to have appendicitis and general surgery performed a Lap appendectomy. ID was consulted initially for management of HIV.   Hospital Course:  1.  acute Appendicitis:  - Patient seen by general surgery and had laparoscopic appendectomy - Patient was empirically placed on IV Zosyn. A repeat CT of abdomen was done to rule out for abscess given elevation of WBC -CT scan of the abdomen and pelvis done on 7/11 showed a small collection of fluid over right pelvis. (Please see the CT scan report below) patient is currently afebrile without any leukocytosis and clinically improving. Surgery consult do not recommend any intervention of this collection. Axis disease consult recommended treating him with 2 more weeks of oral Augmentin until he gets a repeat CT scan of the abdomen to evaluate for resolution of this  collection.   3. HIV Infection:  - Has good control of the infection and follows with Dr. [name] - On Atripla   4. GERD:  -Resume PPI  5. Dyslipidemia:  - Continue home medications  6. Hypokalemia Noted for persistently low k and being replenished daily. i will send him out on oral kcl 40 meq daily. Needs outpt follow up of k level within 1 week.   Code Status: full   Disposition Plan: home  Consultants:  ID. Dr. Orvan Falconer  General surgery Dr. Donell Beers   Procedures: Laparoscopic appendectomy on 6/24     Discharge Exam: Filed Vitals:   10/06/12 0615 10/06/12 1318 10/06/12 2200 10/07/12 0611  BP: 123/79 135/79 136/78 121/77  Pulse: 72 93 88 69  Temp: 98.2 F (36.8 C) 98.5 F (36.9 C) 98.8 F (37.1 C) 98 F (36.7 C)  TempSrc: Oral Oral Oral Oral  Resp: 18 20 20 18   Height:      Weight:      SpO2: 93% 95% 94% 97%    General: middle aged male lying in bed in no acute distress HEENT: No pallor, moist oral mucosa Chest: Clear to auscultation bilaterally, no added sounds Abdomen: Soft, mild right lower quadrant tenderness, nondistended, bowel sounds present Extremities: Warm, no edema CNS: AAO x3   Discharge Instructions     Medication List         amoxicillin-clavulanate 875-125 MG per tablet  Commonly known as:  AUGMENTIN  Take 1 tablet by mouth every 12 (twelve) hours.( until 7/16)     aspirin 81 MG tablet  Take 81  mg by mouth daily.     atorvastatin 20 MG tablet  Commonly known as:  LIPITOR  Take 1 tablet (20 mg total) by mouth daily.     ATRIPLA 600-200-300 MG per tablet  Generic drug:  efavirenz-emtricitabine-tenofovir  TAKE 1 TABLET BY MOUTH AT BEDTIME     docusate sodium 100 MG capsule  Commonly known as:  COLACE  Take 1 capsule (100 mg total) by mouth 2 (two) times daily as needed for constipation.     Fish Oil 1200 MG Caps  Take 3,600 mg by mouth daily.     niacin 1000 MG CR tablet  Commonly known as:  NIASPAN  Take 2 tablets  (2,000 mg total) by mouth at bedtime.     omeprazole 20 MG capsule  Commonly known as:  PRILOSEC  Take 1 capsule (20 mg total) by mouth daily.     oxyCODONE-acetaminophen 5-325 MG per tablet  Commonly known as:  PERCOCET/ROXICET  Take 2 tablets by mouth every 6 (six) hours as needed.      Potassium chloride 20 meq tab  1 tab oral bid   No Known Allergies     Follow-up Information   Follow up with BYERLY,FAERA, MD. Schedule an appointment as soon as possible for a visit in 2 weeks. (When you call tell them you need a CT scan before you see DR. Donell Beers.)    Contact information:   [address] [place] [address] [phone]       Follow up with Acey Lav, MD In 2 weeks.   Contact information:   [address] [place] [address] [phone]        The results of significant diagnostics from this hospitalization (including imaging, microbiology, ancillary and laboratory) are listed below for reference.    Significant Diagnostic Studies: Dg Chest 2 View  10/03/2012   *RADIOLOGY REPORT*  Clinical Data: Status post appendectomy.  CHEST - 2 VIEW  Comparison: 10/03/2012 at 09:48 hours  Findings: No significant interval change.  Patchy right perihilar, right basilar and left basilar opacities.  No effusions or pneumothoraces.  Heart and mediastinal structures appear normal.  IMPRESSION: The bilateral opacities which may represent changes due to atelectasis and/or infiltrate.  No significant interval change from the prior study.   Original Report Authenticated By: Sander Radon, M.D.   Ct Abdomen Pelvis W Contrast  10/06/2012   *RADIOLOGY REPORT*  Clinical Data: Post appendectomy 6 days ago, leukocytosis, history HIV  CT ABDOMEN AND PELVIS WITH CONTRAST  Technique:  Multidetector CT imaging of the abdomen and pelvis was performed following the standard protocol during bolus administration of intravenous contrast. Sagittal and  coronal MPR images reconstructed from axial data set.  Contrast: OMNIPAQUE IOHEXOL 300 MG/ML  SOLN Dilute oral contrast.  Comparison: 09/29/2012  Findings: Small bibasilar pleural effusions with atelectasis in both lower lobes. Small right renal cyst. Liver, spleen, pancreas, kidneys, and adrenal glands otherwise normal. Surgical drain identified in right lower quadrant extending to sub hepatic space. Interval appendectomy. Scattered stranding of tissue planes in right pelvis/right lower quadrant identified.  Small focal fluid collection is identified in the right lower quadrant, 2.5 x 2.5 x 1.8 cm, could represent a sterile or infected postoperative collection. Additional tiny lenticular fluid collection in right upper pelvis, slightly more inferior to the larger collection may represent an adjacent versus contiguous postoperative fluid collection, 17 x 9 x 11 mm. Small dependent fluid collection in  pelvis between bladder and rectum, with questionable enhancing margins, 3.6 x 1.6 1.4 cm, cannot exclude additional abscess though this could represent dependent free pelvic fluid, extending slightly to the left.  Thickened distal small bowel loops compatible with inflammatory process and surgery. Small lipoma within left gluteus minimus muscle. Dilated small bowel loops with decompressed colon question postoperative ileus. Unremarkable bladder and ureters. Diverticulosis of sigmoid colon. No mass, adenopathy, free air, hernia, or acute bony lesion.  IMPRESSION: Post appendectomy. Small fluid collections in pelvis could represent sterile or infected postoperative collections, including a right pelvic collection 2.5 cm in greatest size and a dependent pelvic collection measuring 3.6 cm greatest size. Suspect postoperative ileus. Bibasilar pleural effusions atelectasis.   Original Report Authenticated By: Ulyses Southward, M.D.   Ct Abdomen Pelvis W Contrast  09/29/2012   *RADIOLOGY REPORT*  Clinical Data: Lower  abdominal pain.  Evaluate for appendicitis or gastroenteritis.  Equivocal appendix on prior exam.HIV positive.  CT ABDOMEN AND PELVIS WITH CONTRAST  Technique:  Multidetector CT imaging of the abdomen and pelvis was performed following the standard protocol during bolus administration of intravenous contrast.  Contrast: OMNIPAQUE IOHEXOL 300 MG/ML  SOLN  Comparison: 09/27/2012  Findings: Lung bases:  Development of bibasilar collapse / consolidative change.  Slightly greater on the right than left.  Mild cardiomegaly with new trace bilateral pleural effusions.  Abdomen/pelvis:  Normal liver, spleen, stomach, pancreas, gallbladder, biliary tract, adrenal glands.  Too small to characterize right renal lesions which are likely cysts.  Normal left kidney.  Left-sided IVC. No retroperitoneal or retrocrural adenopathy.  Extensive colonic diverticulosis.  The colon is normal in caliber. Normal terminal ileum.  The appendix is dilated and thick-walled, including image 55/series 2.  There is intraluminal gas.  No extraluminal gas or periappendiceal fluid collection is seen.  Edema and peritoneal thickening are centered in the right lower quadrant, including on image 56/series 2.  Mild small bowel dilatation at up to 3.9 cm image 62/series 2.  No focal transition point identified.  Suspicion of mild distal ileal wall thickening, including image 75/series 2.  Tiny fat containing left inguinal hernia. No pelvic adenopathy. Normal urinary bladder and prostate.  Development of small volume cul-de-sac fluid, which appears simple.  Bones/Musculoskeletal:  No acute osseous abnormality.  IMPRESSION:  1.  Development of inflammation centered about the right lower quadrant.  Concurrent appendiceal wall thickening and dilatation with intraluminal gas.  Suggestion of synchronous adjacent ileal wall thickening.  Considerations include an atypical appearance of appendicitis (with intraluminal gas) or infectious enteritis with  secondary appendiceal inflammation. Case discussed with Dr. Donell Beers at 2 p.m. 2.  Small volume cul-de-sac fluid which is new and presumably related to the right lower quadrant inflammatory process. 3.  Development of collapse / consolidative change at the lung bases.  Infection versus atelectasis.  Concurrent new small bilateral pleural effusions. 4.  Left-sided IVC.   Original Report Authenticated By: Jeronimo Greaves, M.D.   Ct Abdomen Pelvis W Contrast  09/27/2012   *RADIOLOGY REPORT*  Clinical Data: Abdominal pain, HIV.  Nausea vomiting diarrhea  CT ABDOMEN AND PELVIS WITH CONTRAST  Technique:  Multidetector CT imaging of the abdomen and pelvis was performed following the standard protocol during bolus administration of intravenous contrast.  Contrast: 25mL OMNIPAQUE IOHEXOL 300 MG/ML  SOLN, 25mL OMNIPAQUE IOHEXOL 300 MG/ML  SOLN, OMNIPAQUE IOHEXOL 300 MG/ML  SOLN  Comparison: None.  Findings: Lung bases are clear.  Liver gallbladder and bile ducts are normal.  The  pancreas spleen and kidneys are normal.  There is thickening of the gastric antrum which is circumferential without focal mass lesion.  Gastric body is normal.  Negative for small bowel obstruction.  There is sigmoid diverticulosis without evidence of diverticulitis.  No abscess mass or free fluid is seen.  The appendix is diffusely dilated and filled with fluid and gas. The appendix   wall does not appear to be thickened.  There is minimal stranding in the periappendiceal fat at the base the appendix.  IMPRESSION: Sigmoid diverticulosis without diverticulitis.  The appendix is prominent size but could be within normal limits. There is slight periappendiceal stranding which could represent early appendicitis.  Correlation with  symptom location is suggested.  Thickening of the gastric antrum which may represent gastritis. Gastric tumor considered less likely given the finding  which is relatively mild.   Original Report Authenticated By: Janeece Riggers, M.D.   Dg Chest Port 1 View  10/03/2012   *RADIOLOGY REPORT*  Clinical Data: Hypoxia, fever, postop appendectomy.  PORTABLE CHEST - 1 VIEW  Comparison: 02/06/2008.  Findings: Semi lordotic projection.  The heart and mediastinal contours are normal.  Hazy interstitial changes are present in each right perihilar region.  Linear atelectasis overlies the left heart border.  There is slightly greater opacity behind the heart obscuring the margin of the left hemidiaphragm.  The lungs otherwise appear clear.  There are no effusions or pneumothoraces. There is prominence of the small bowel loops in the left upper quadrant of the abdomen.  IMPRESSION: The changes in the lungs may be due to atelectasis alone. Postoperative pneumonia, especially in the left base, should also be considered clinically.  The patient does have some prominent soft small bowel loops likely related to ileus.   Original Report Authenticated By: Sander Radon, M.D.    Microbiology: Recent Results (from the past 240 hour(s))  CULTURE, BLOOD (ROUTINE X 2)     Status: None   Collection Time    09/28/12  1:15 AM      Result Value Range Status   Specimen Description BLOOD RIGHT ARM   Final   Special Requests BOTTLES DRAWN AEROBIC AND ANAEROBIC    Final   Culture  Setup Time 09/28/2012 09:30   Final   Culture NO GROWTH 5 DAYS   Final   Report Status 10/04/2012 FINAL   Final  CULTURE, BLOOD (ROUTINE X 2)     Status: None   Collection Time    09/28/12  1:22 AM      Result Value Range Status   Specimen Description BLOOD RIGHT ARM   Final   Special Requests BOTTLES DRAWN AEROBIC AND ANAEROBIC    Final   Culture  Setup Time 09/28/2012 09:30   Final   Culture NO GROWTH 5 DAYS   Final   Report Status 10/04/2012 FINAL   Final  CLOSTRIDIUM DIFFICILE BY PCR     Status: None   Collection Time    09/28/12  1:59 PM      Result Value Range Status   C difficile by pcr NEGATIVE  NEGATIVE Final  SURGICAL PCR SCREEN     Status:  None   Collection Time    09/30/12 10:00 AM      Result Value Range Status   MRSA, PCR NEGATIVE  NEGATIVE Final   Staphylococcus aureus NEGATIVE  NEGATIVE Final   Comment:            The Xpert SA Assay (FDA  approved for NASAL specimens     in patients over 57 years of age),     is one component of     a comprehensive surveillance     program.  Test performance has     been validated by [employer] for patients greater     than or equal to 31 year old.     It is not intended     to diagnose infection nor to     guide or monitor treatment.  URINE CULTURE     Status: None   Collection Time    09/30/12  1:00 PM      Result Value Range Status   Specimen Description URINE, CLEAN CATCH   Final   Special Requests NONE   Final   Culture  Setup Time 10/01/2012 01:36   Final   Colony Count NO GROWTH   Final   Culture NO GROWTH   Final   Report Status 10/01/2012 FINAL   Final     Labs: Basic Metabolic Panel:  Recent Labs Lab 10/01/12 0106 10/03/12 0545 10/06/12 0515 10/07/12 0445  NA 132* 134* 135 137  K 3.3* 3.2* 3.0* 3.4*  CL 96 97 98 98  CO2 28 30 30  32  GLUCOSE 142* 130* 124* 110*  BUN 5* 4* 4* 4*  CREATININE 0.81 0.72 0.76 0.84  CALCIUM 7.7* 8.1* 8.1* 8.2*  MG  --   --  2.2  --    Liver Function Tests: No results found for this basename: AST, ALT, ALKPHOS, BILITOT, PROT, ALBUMIN,  in the last 168 hours No results found for this basename: LIPASE, AMYLASE,  in the last 168 hours No results found for this basename: AMMONIA,  in the last 168 hours CBC:  Recent Labs Lab 10/01/12 0106 10/03/12 0545 10/05/12 0505 10/06/12 0515 10/07/12 0445  WBC 11.0* 10.3 9.2 12.0* 10.4  NEUTROABS 9.1*  --   --   --   --   HGB 12.8* 11.3* 11.6* 12.4* 11.7*  HCT 37.6* 34.0* 34.5* 36.1* 34.1*  MCV 94.2 94.4 93.0 93.0 93.7  PLT 103* 142* 194 250 242   Cardiac Enzymes: No results found for this basename: CKTOTAL, CKMB, CKMBINDEX, TROPONINI,  in the last 168  hours BNP: BNP (last 3 results) No results found for this basename: PROBNP,  in the last 8760 hours CBG: No results found for this basename: GLUCAP,  in the last 168 hours     Signed:  Johann Gascoigne  [facility] 10/07/2012, 10:20 AM

## 2012-10-07 NOTE — Progress Notes (Signed)
7 Days Post-Op  Subjective: No complaints.  Objective: Vital signs in last 24 hours: Temp:  [98 F (36.7 C)-98.8 F (37.1 C)] 98 F (36.7 C) (07/02 0611) Pulse Rate:  [69-93] 69 (07/02 0611) Resp:  [18-20] 18 (07/02 0611) BP: (121-136)/(77-79) 121/77 mmHg (07/02 0611) SpO2:  [94 %-97 %] 97 % (07/02 0611) Last BM Date: 11/03/12  Intake/Output from previous day: 07/01 0701 - 07/02 0700 In: 637 [P.O.:37; I.V.:400; IV Piggyback:200] Out: 685 [Urine:675; Drains:10] Intake/Output this shift:    PE: General- In NAD Abdomen-soft, RLQ incision clean and intact, minimal serous drain output  Lab Results:   Recent Labs  10/06/12 0515 10/07/12 0445  WBC 12.0* 10.4  HGB 12.4* 11.7*  HCT 36.1* 34.1*  PLT 250 242   BMET  Recent Labs  10/06/12 0515 10/07/12 0445  NA 135 137  K 3.0* 3.4*  CL 98 98  CO2 30 32  GLUCOSE 124* 110*  BUN 4* 4*  CREATININE 0.76 0.84  CALCIUM 8.1* 8.2*   PT/INR No results found for this basename: LABPROT, INR,  in the last 72 hours Comprehensive Metabolic Panel:    Component Value Date/Time   NA 137 10/07/2012 0445   K 3.4* 10/07/2012 0445   CL 98 10/07/2012 0445   CO2 32 10/07/2012 0445   BUN 4* 10/07/2012 0445   CREATININE 0.84 10/07/2012 0445   CREATININE 0.91 06/03/2012 0951   GLUCOSE 110* 10/07/2012 0445   CALCIUM 8.2* 10/07/2012 0445   AST 12 09/29/2012 0515   ALT 15 09/29/2012 0515   ALKPHOS 41 09/29/2012 0515   BILITOT 0.9 09/29/2012 0515   PROT 5.9* 09/29/2012 0515   ALBUMIN 2.7* 09/29/2012 0515     Studies/Results: Ct Abdomen Pelvis W Contrast  10/06/2012   *RADIOLOGY REPORT*  Clinical Data: Post appendectomy 6 days ago, leukocytosis, history HIV  CT ABDOMEN AND PELVIS WITH CONTRAST  Technique:  Multidetector CT imaging of the abdomen and pelvis was performed following the standard protocol during bolus administration of intravenous contrast. Sagittal and coronal MPR images reconstructed from axial data set.  Contrast: OMNIPAQUE IOHEXOL  300 MG/ML  SOLN Dilute oral contrast.  Comparison: 09/29/2012  Findings: Small bibasilar pleural effusions with atelectasis in both lower lobes. Small right renal cyst. Liver, spleen, pancreas, kidneys, and adrenal glands otherwise normal. Surgical drain identified in right lower quadrant extending to sub hepatic space. Interval appendectomy. Scattered stranding of tissue planes in right pelvis/right lower quadrant identified.  Small focal fluid collection is identified in the right lower quadrant, 2.5 x 2.5 x 1.8 cm, could represent a sterile or infected postoperative collection. Additional tiny lenticular fluid collection in right upper pelvis, slightly more inferior to the larger collection may represent an adjacent versus contiguous postoperative fluid collection, 17 x 9 x 11 mm. Small dependent fluid collection in pelvis between bladder and rectum, with questionable enhancing margins, 3.6 x 1.6 1.4 cm, cannot exclude additional abscess though this could represent dependent free pelvic fluid, extending slightly to the left.  Thickened distal small bowel loops compatible with inflammatory process and surgery. Small lipoma within left gluteus minimus muscle. Dilated small bowel loops with decompressed colon question postoperative ileus. Unremarkable bladder and ureters. Diverticulosis of sigmoid colon. No mass, adenopathy, free air, hernia, or acute bony lesion.  IMPRESSION: Post appendectomy. Small fluid collections in pelvis could represent sterile or infected postoperative collections, including a right pelvic collection 2.5 cm in greatest size and a dependent pelvic collection measuring 3.6 cm greatest size. Suspect postoperative  ileus. Bibasilar pleural effusions atelectasis.   Original Report Authenticated By: Ulyses Southward, M.D.    Anti-infectives: Anti-infectives   Start     Dose/Rate Route Frequency Ordered Stop   09/30/12 1600  piperacillin-tazobactam (ZOSYN) IVPB 3.375 g     3.375 g 12.5 mL/hr over  240 Minutes Intravenous Every 8 hours 09/30/12 1447     09/28/12 0900  ciprofloxacin (CIPRO) IVPB 400 mg  Status:  Discontinued     400 mg 200 mL/hr over 60 Minutes Intravenous Every 12 hours 09/28/12 0742 09/30/12 1447   09/28/12 0800  metroNIDAZOLE (FLAGYL) IVPB 500 mg  Status:  Discontinued     500 mg 100 mL/hr over 60 Minutes Intravenous Every 8 hours 09/28/12 0742 09/30/12 1447   09/27/12 2300  efavirenz-emtricitabine-tenofovir (ATRIPLA) 600-200-300 MG per tablet 1 tablet     1 tablet Oral Daily at bedtime 09/27/12 2210        Assessment   Appendicitis, acute, with peritonitis s/p lap appy June 2014-CT demonstrates small pelvic fluid collections ? Abscess; WBC down.  I do not think these need to be drained     LOS: 10 days   Plan: Okay for discharge on Augmentin (as recommended by Dr. [name]) for 2 weeks.  Instructions given.  Follow up with Dr. Donell Beers in 2 weeks ([phone] to make appointment).    Russell Cooper 10/07/2012

## 2012-10-07 NOTE — Progress Notes (Signed)
Patient discharge home with sufficient other, alert and oriented, discharge instructions given, patient verbalize understand of orders given, My Chart access already established, patient in stable condition at this time

## 2012-10-12 ENCOUNTER — Telehealth (INDEPENDENT_AMBULATORY_CARE_PROVIDER_SITE_OTHER): Payer: Self-pay | Admitting: General Surgery

## 2012-10-12 NOTE — Telephone Encounter (Signed)
Patient is  Aware of ct scan on 10/19/12 at 1 am  He was instructed to pick up contrast at [address] [facility] telephone number

## 2012-10-13 MED FILL — Metronidazole in NaCl 0.79% IV Soln 500 MG/100ML: INTRAVENOUS | Qty: 100 | Status: AC

## 2012-10-19 ENCOUNTER — Ambulatory Visit
Admission: RE | Admit: 2012-10-19 | Discharge: 2012-10-19 | Disposition: A | Discharge status: Home / Self Care | Payer: BC Managed Care – PPO | Source: Ambulatory Visit | Attending: General Surgery | Admitting: General Surgery

## 2012-10-19 DIAGNOSIS — K3580 Unspecified acute appendicitis: Secondary | ICD-10-CM

## 2012-10-19 MED ORDER — IOHEXOL 300 MG/ML  SOLN
100.0000 mL | Freq: Once | INTRAMUSCULAR | Status: AC | PRN
  Administered 2012-10-19: 100 mL via INTRAVENOUS

## 2012-10-20 ENCOUNTER — Telehealth (INDEPENDENT_AMBULATORY_CARE_PROVIDER_SITE_OTHER): Payer: Self-pay

## 2012-10-20 NOTE — Telephone Encounter (Signed)
LMOV to call.  No acute findings on CT scan.  Remind pt of his post op appt with Dr. Donell Beers 11/03/12 at 2pm.

## 2012-10-20 NOTE — Telephone Encounter (Signed)
Patient called back and was given below message.  Patient states understanding at this time. 

## 2012-10-21 ENCOUNTER — Ambulatory Visit (INDEPENDENT_AMBULATORY_CARE_PROVIDER_SITE_OTHER): Payer: BC Managed Care – PPO | Admitting: Internal Medicine

## 2012-10-21 ENCOUNTER — Encounter: Payer: Self-pay | Admitting: Internal Medicine

## 2012-10-21 VITALS — BP 138/89 | HR 89 | Temp 98.3°F | Ht 69.0 in | Wt 175.0 lb

## 2012-10-21 DIAGNOSIS — K3533 Acute appendicitis with perforation and localized peritonitis, with abscess: Secondary | ICD-10-CM

## 2012-10-21 DIAGNOSIS — B20 Human immunodeficiency virus [HIV] disease: Secondary | ICD-10-CM

## 2012-10-21 DIAGNOSIS — K352 Acute appendicitis with generalized peritonitis, without abscess: Secondary | ICD-10-CM

## 2012-10-21 LAB — CBC WITH DIFFERENTIAL/PLATELET
Eosinophils Absolute: 0.1 10*3/uL (ref 0.0–0.7)
Hemoglobin: 13.4 g/dL (ref 13.0–17.0)
Lymphs Abs: 2.2 10*3/uL (ref 0.7–4.0)
MCH: 31.7 pg (ref 26.0–34.0)
Monocytes Relative: 8 % (ref 3–12)
Neutrophils Relative %: 54 % (ref 43–77)
RBC: 4.23 MIL/uL (ref 4.22–5.81)

## 2012-10-21 NOTE — Progress Notes (Signed)
  Subjective:    Patient ID: Russell Cooper, male    DOB: Mar 12, 1971, 42 y.o.   MRN: [id number]  HPI He comes in for hospital followup. He was diagnosed with appendicitis and underwent surgery. He continued to take his Atripla during his hospitalization.  He feels well and his incision has healed. He did have a repeat set CAT scan 2 days ago and his fluid collection is almost resolved. He has one more dose of Augmentin today. No diarrhea, no abdominal pain. Appetite is good   Review of Systems  Constitutional: Negative for fatigue.  Cardiovascular: Negative for leg swelling.  Gastrointestinal: Negative for nausea, abdominal pain, diarrhea and abdominal distention.  Skin: Negative for rash.       Healed incision  Psychiatric/Behavioral: The patient is not nervous/anxious.        Objective:   Physical Exam  Skin:  Incision is closed, Steri-Strips in place. Steri-Strips are now come off with exam. No discharge nor erythema          Assessment & Plan:

## 2012-10-21 NOTE — Assessment & Plan Note (Signed)
Repeat CAT scan is good and some minimal residual fluid collection. He will finish his antibiotic and stop.

## 2012-10-21 NOTE — Assessment & Plan Note (Signed)
Will recheck labs today and otherwise if okay he can return in 3 months with his primary provider

## 2012-10-22 ENCOUNTER — Other Ambulatory Visit: Payer: Self-pay | Admitting: *Deleted

## 2012-10-22 DIAGNOSIS — B356 Tinea cruris: Secondary | ICD-10-CM

## 2012-10-22 LAB — COMPLETE METABOLIC PANEL WITH GFR
ALT: 20 U/L (ref 0–53)
AST: 17 U/L (ref 0–37)
Alkaline Phosphatase: 67 U/L (ref 39–117)
CO2: 28 mEq/L (ref 19–32)
Creat: 0.97 mg/dL (ref 0.50–1.35)
GFR, Est African American: >89 mL/min
Total Bilirubin: 0.6 mg/dL (ref 0.3–1.2)

## 2012-10-22 MED ORDER — FLUCONAZOLE 200 MG PO TABS: 200.0000 mg | ORAL_TABLET | Freq: Every day | ORAL | Status: DC

## 2012-10-23 LAB — HIV-1 RNA QUANT-NO REFLEX-BLD
HIV 1 RNA Quant: <20 copies/mL (ref <20)
HIV-1 RNA Quant, Log: <1.3 {Log} (ref <1.30)

## 2012-10-26 ENCOUNTER — Other Ambulatory Visit: Payer: Self-pay | Admitting: Infectious Disease

## 2012-10-26 DIAGNOSIS — B20 Human immunodeficiency virus [HIV] disease: Secondary | ICD-10-CM

## 2012-11-03 ENCOUNTER — Ambulatory Visit (INDEPENDENT_AMBULATORY_CARE_PROVIDER_SITE_OTHER): Payer: BC Managed Care – PPO | Admitting: General Surgery

## 2012-11-03 ENCOUNTER — Other Ambulatory Visit: Payer: Self-pay | Admitting: *Deleted

## 2012-11-03 ENCOUNTER — Encounter (INDEPENDENT_AMBULATORY_CARE_PROVIDER_SITE_OTHER): Payer: Self-pay | Admitting: General Surgery

## 2012-11-03 VITALS — BP 130/78 | HR 84 | Resp 16 | Ht 69.0 in | Wt 175.6 lb

## 2012-11-03 DIAGNOSIS — K3533 Acute appendicitis with perforation and localized peritonitis, with abscess: Secondary | ICD-10-CM

## 2012-11-03 DIAGNOSIS — K219 Gastro-esophageal reflux disease without esophagitis: Secondary | ICD-10-CM

## 2012-11-03 DIAGNOSIS — K352 Acute appendicitis with generalized peritonitis, without abscess: Secondary | ICD-10-CM

## 2012-11-03 MED ORDER — OMEPRAZOLE 20 MG PO CPDR: 20.0000 mg | DELAYED_RELEASE_CAPSULE | Freq: Every day | ORAL | Status: AC

## 2012-11-03 NOTE — Progress Notes (Signed)
HISTORY: Patient is a 42 year old HIV-positive male who is status post laparoscopic assisted appendectomy on 09/30/2012. He had an unusual course as his appendix was in the right upper quadrant and retrocecal in location.  He feels back to normal. He has gone back to work. He has not had any fevers or chills. Has followup CT scan was negative for concerning fluid collection.  His diet is normal and he is having normal bowel movements. He takes an occasional ibuprofen for discomfort but did not go home on any narcotics.   EXAM: General:  Alert and oriented Incision:  Well healed.     PATHOLOGY: Acute appendicitis and periappendicitis.   ASSESSMENT AND PLAN:   Appendicitis, acute, with peritonitis s/p lap appy June 2014 Pt doing well.  Cleared from restrictions.  Follow up as needed.        Maudry Diego, MD Surgical Oncology, General & Endocrine Surgery [facility]  Acey Lav, MD [name], Lisette Grinder, MD

## 2012-11-03 NOTE — Patient Instructions (Signed)
OK to resume normal activities in general.   Avoid lifting over 50 pounds until Monday August 4

## 2012-11-03 NOTE — Assessment & Plan Note (Signed)
Pt doing well.  Cleared from restrictions.  Follow up as needed.

## 2012-12-21 ENCOUNTER — Other Ambulatory Visit: Payer: BC Managed Care – PPO

## 2012-12-21 DIAGNOSIS — Z113 Encounter for screening for infections with a predominantly sexual mode of transmission: Secondary | ICD-10-CM

## 2012-12-21 DIAGNOSIS — B20 Human immunodeficiency virus [HIV] disease: Secondary | ICD-10-CM

## 2012-12-21 LAB — CBC WITH DIFFERENTIAL/PLATELET
Basophils Relative: 1 % (ref 0–1)
Eosinophils Absolute: 0 10*3/uL (ref 0.0–0.7)
HCT: 42 % (ref 39.0–52.0)
Hemoglobin: 14.7 g/dL (ref 13.0–17.0)
Lymphs Abs: 1.8 10*3/uL (ref 0.7–4.0)
MCH: 32.1 pg (ref 26.0–34.0)
MCHC: 35 g/dL (ref 30.0–36.0)
MCV: 91.7 fL (ref 78.0–100.0)
Monocytes Absolute: 0.4 10*3/uL (ref 0.1–1.0)
Monocytes Relative: 7 % (ref 3–12)
RBC: 4.58 MIL/uL (ref 4.22–5.81)

## 2012-12-21 LAB — COMPLETE METABOLIC PANEL WITH GFR
AST: 16 U/L (ref 0–37)
Albumin: 4.8 g/dL (ref 3.5–5.2)
BUN: 8 mg/dL (ref 6–23)
CO2: 26 mEq/L (ref 19–32)
Calcium: 9.3 mg/dL (ref 8.4–10.5)
Chloride: 101 mEq/L (ref 96–112)
GFR, Est African American: >89 mL/min
GFR, Est Non African American: >89 mL/min
Glucose, Bld: 105 mg/dL — ABNORMAL HIGH (ref 70–99)
Potassium: 4.5 mEq/L (ref 3.5–5.3)

## 2012-12-21 LAB — LIPID PANEL
LDL Cholesterol: 113 mg/dL — ABNORMAL HIGH (ref 0–99)
Total CHOL/HDL Ratio: 4 Ratio
VLDL: 19 mg/dL (ref 0–40)

## 2012-12-22 LAB — T-HELPER CELL (CD4) - (RCID CLINIC ONLY): CD4 % Helper T Cell: 43 % (ref 33–55)

## 2013-01-05 ENCOUNTER — Encounter: Payer: Self-pay | Admitting: Infectious Disease

## 2013-01-05 ENCOUNTER — Ambulatory Visit (INDEPENDENT_AMBULATORY_CARE_PROVIDER_SITE_OTHER): Payer: BC Managed Care – PPO | Admitting: Infectious Disease

## 2013-01-05 VITALS — BP 132/87 | HR 90 | Temp 98.4°F | Ht 68.0 in | Wt 181.0 lb

## 2013-01-05 DIAGNOSIS — I10 Essential (primary) hypertension: Secondary | ICD-10-CM

## 2013-01-05 DIAGNOSIS — F329 Major depressive disorder, single episode, unspecified: Secondary | ICD-10-CM

## 2013-01-05 DIAGNOSIS — F17200 Nicotine dependence, unspecified, uncomplicated: Secondary | ICD-10-CM

## 2013-01-05 DIAGNOSIS — K352 Acute appendicitis with generalized peritonitis, without abscess: Secondary | ICD-10-CM

## 2013-01-05 DIAGNOSIS — Z23 Encounter for immunization: Secondary | ICD-10-CM

## 2013-01-05 DIAGNOSIS — B20 Human immunodeficiency virus [HIV] disease: Secondary | ICD-10-CM

## 2013-01-05 DIAGNOSIS — K3532 Acute appendicitis with perforation and localized peritonitis, without abscess: Secondary | ICD-10-CM

## 2013-01-05 NOTE — Progress Notes (Signed)
Subjective:    Patient ID: Russell Cooper, male    DOB: 13-Jun-1970, 42 y.o.   MRN: [id number]  HPI   Russell Cooper is a 42 y.o. male who is doing superbly well on his  antiviral regimen, with undetectable viral load and health cd4 count on atripla.  He suffered from ruptured appendicitis this Spring and underwent surgery by [facility]. He had postop fluid collection and was rx with IV abx and then augmentin. This has completely resolved.  He and his partner are moving to [facility] and need an HIV provider there. I used [employer] website to pull up some clinics to show him for reference.     Review of Systems  Constitutional: Negative for fever, chills, diaphoresis, activity change, appetite change, fatigue and unexpected weight change.  HENT: Negative for congestion, sore throat, rhinorrhea, sneezing, trouble swallowing and sinus pressure.   Eyes: Negative for photophobia and visual disturbance.  Respiratory: Negative for cough, chest tightness, shortness of breath, wheezing and stridor.   Cardiovascular: Negative for chest pain, palpitations and leg swelling.  Gastrointestinal: Negative for nausea, vomiting, abdominal pain, diarrhea, constipation, blood in stool, abdominal distention and anal bleeding.  Genitourinary: Negative for dysuria, hematuria, flank pain and difficulty urinating.  Musculoskeletal: Negative for myalgias, back pain, joint swelling, arthralgias and gait problem.  Skin: Negative for color change, pallor, rash and wound.  Neurological: Negative for dizziness, tremors, weakness and light-headedness.  Hematological: Negative for adenopathy. Does not bruise/bleed easily.  Psychiatric/Behavioral: Negative for suicidal ideas, behavioral problems, confusion, sleep disturbance, self-injury, dysphoric mood, decreased concentration and agitation. The patient is not nervous/anxious.        Objective:   Physical Exam  Constitutional: He is oriented to  person, place, and time. He appears well-developed and well-nourished. No distress.  HENT:  Head: Normocephalic and atraumatic.  Mouth/Throat: Oropharynx is clear and moist. No oropharyngeal exudate.  Eyes: Conjunctivae and EOM are normal. Pupils are equal, round, and reactive to light. No scleral icterus.  Neck: Normal range of motion. Neck supple. No JVD present.  Cardiovascular: Normal rate, regular rhythm and normal heart sounds.  Exam reveals no gallop and no friction rub.   No murmur heard. Pulmonary/Chest: Effort normal and breath sounds normal. No respiratory distress. He has no wheezes. He has no rales. He exhibits no tenderness.  Abdominal: He exhibits no distension and no mass. There is no tenderness. There is no rebound and no guarding.  Musculoskeletal: He exhibits no edema and no tenderness.  Lymphadenopathy:    He has no cervical adenopathy.  Neurological: He is alert and oriented to person, place, and time. He exhibits normal muscle tone. Coordination normal.  Skin: Skin is warm and dry. He is not diaphoretic. No erythema. No pallor.  Psychiatric: His behavior is normal. Judgment and thought content normal. His mood appears not anxious. Thought content is not paranoid and not delusional. He exhibits a depressed mood. He expresses no homicidal and no suicidal ideation. He expresses no suicidal plans and no homicidal plans.          Assessment & Plan:  HIV: continue atripla for now, he needs MD in [place], would consider change to non EFV regimen. I spent greater than 45 minutes with the patient including greater than 50% of time in face to face counsel of the patient and in coordination of their care.   Depression: does not feel that he is currently depressed  Smoking: continue to work on trying him to stop  HTN: borderline, partly white coat. Check microalbumin and creatinine  Ruptured appendix: sp surgery, resolved   HCM: flu shot

## 2013-01-19 ENCOUNTER — Other Ambulatory Visit: Payer: BC Managed Care – PPO

## 2013-02-03 ENCOUNTER — Ambulatory Visit: Payer: BC Managed Care – PPO | Admitting: Infectious Disease

## 2013-07-06 ENCOUNTER — Telehealth: Payer: Self-pay | Admitting: *Deleted

## 2013-07-06 ENCOUNTER — Other Ambulatory Visit: Payer: Self-pay | Admitting: Infectious Disease

## 2013-07-06 NOTE — Telephone Encounter (Signed)
Needs appointment

## 2013-07-06 NOTE — Telephone Encounter (Signed)
Patient requested refill of Atripla.  Patient last seen 12/2012 [place]  Authorized one more refill.  Spoke with patient, he is still trying to find a physician.  RN made sure patient understood the importance of following up with a local provider, patient knows he needs to sign a release of information so we can have medical records send his chart.  Patient will investigate his options, will call us if he has an appointment but is low on Atripla. Andree CossHowell, Jayma Volpi M, RN

## 2013-07-12 ENCOUNTER — Telehealth: Payer: Self-pay | Admitting: *Deleted

## 2013-07-12 NOTE — Telephone Encounter (Signed)
Call [place] [facility] and has his first ID appt today. He will sign a release there for his medical records. Wendall MolaJacqueline Cockerham

## 2013-07-27 ENCOUNTER — Other Ambulatory Visit: Payer: Self-pay | Admitting: Infectious Disease

## 2013-08-23 ENCOUNTER — Encounter: Payer: Self-pay | Admitting: *Deleted

## 2014-11-25 IMAGING — CR DG CHEST 1V PORT
1 series · 1 of 1 positions shown · non-contrast
Comparison: 02/06/2008.

CLINICAL DATA: Hypoxia, fever, postop appendectomy.

PORTABLE CHEST - 1 VIEW

[AP]
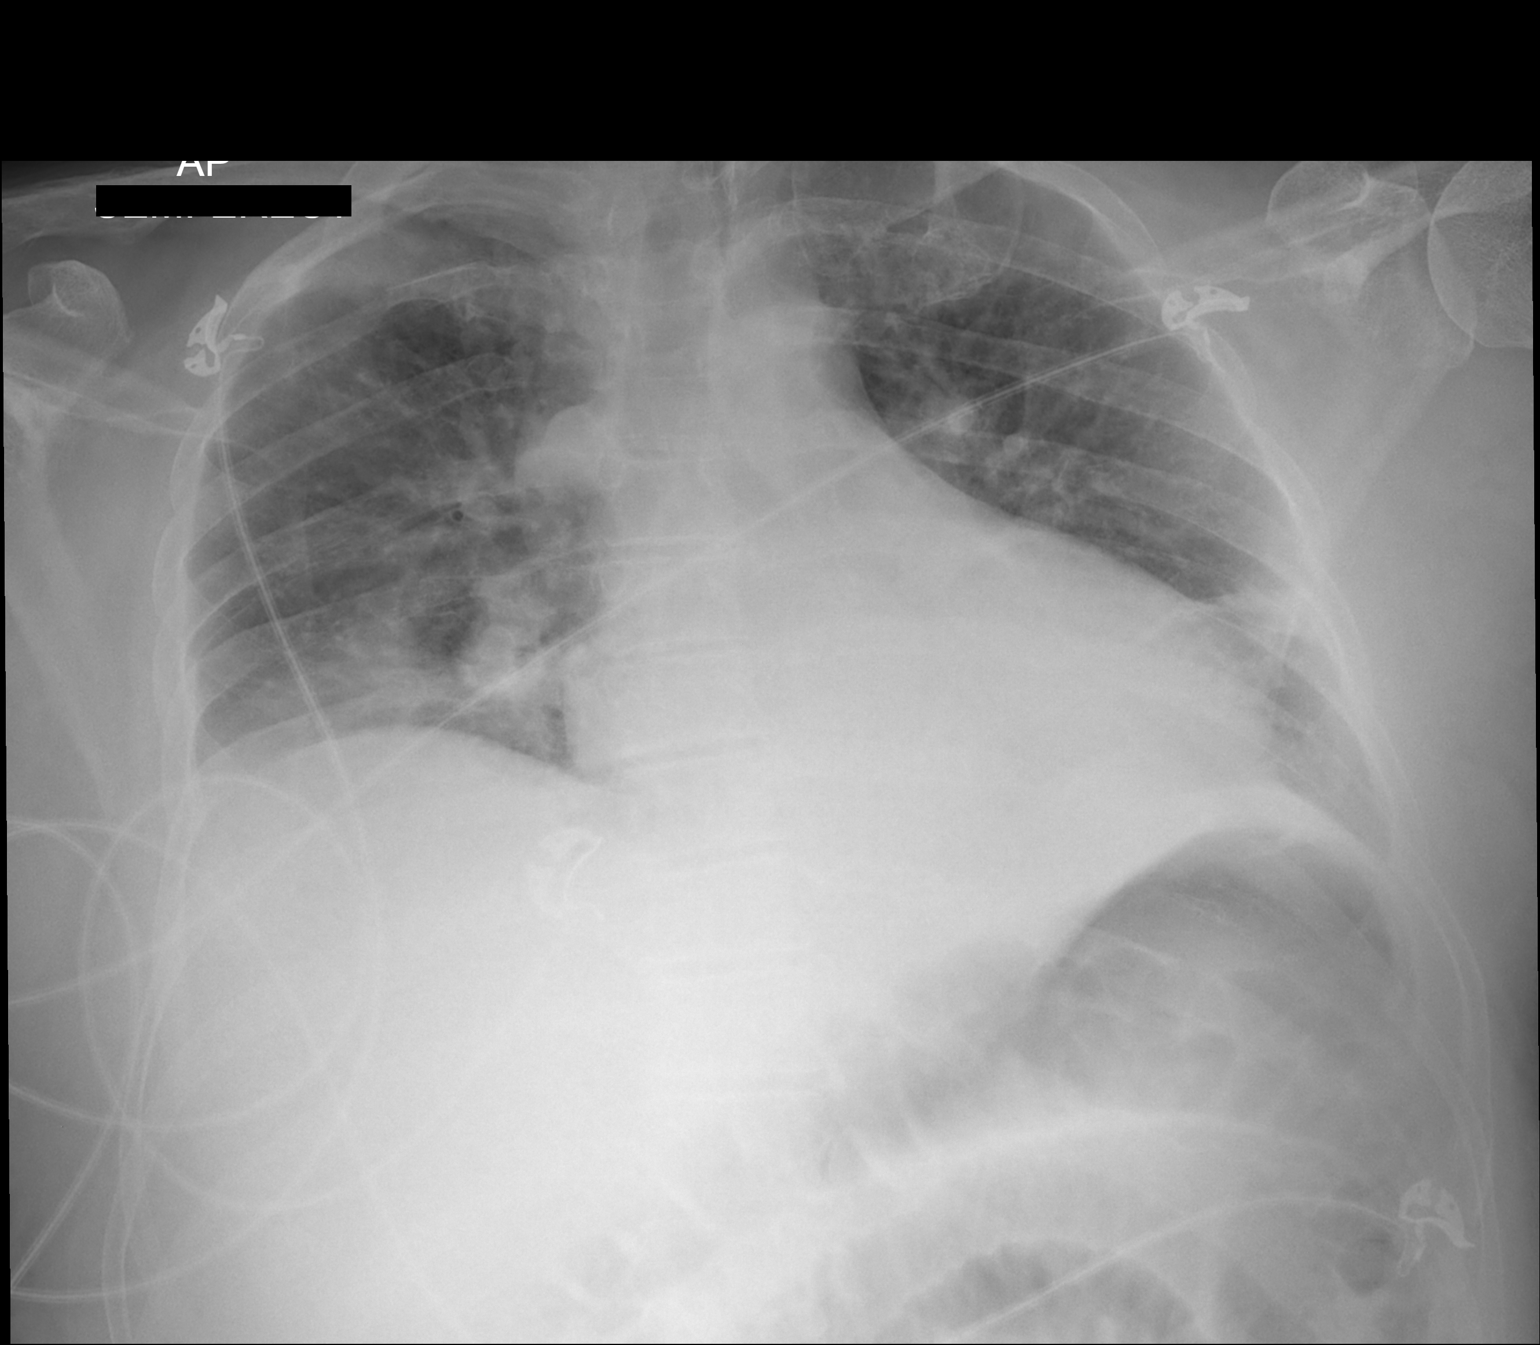

[1 of 1 positions shown; findings below may reference images not displayed]

FINDINGS: Semi lordotic projection.  The heart and mediastinal
contours are normal.  Hazy interstitial changes are present in each
right perihilar region.  Linear atelectasis overlies the left heart
border.  There is slightly greater opacity behind the heart
obscuring the margin of the left hemidiaphragm.  The lungs
otherwise appear clear.  There are no effusions or pneumothoraces.
There is prominence of the small bowel loops in the left upper
quadrant of the abdomen.
IMPRESSION: The changes in the lungs may be due to atelectasis alone.
Postoperative pneumonia, especially in the left base, should also
be considered clinically.  The patient does have some prominent
soft small bowel loops likely related to ileus.

## 2014-11-28 IMAGING — CT CT ABD-PELV W/ CM
1 of 2 series · 15 of 32 positions shown, 19 images · IV contrast (OMNIPAQUE)
Comparison: 09/29/2012

CLINICAL DATA: Post appendectomy 6 days ago, leukocytosis, history
HIV

CT ABDOMEN AND PELVIS WITH CONTRAST
TECHNIQUE: Multidetector CT imaging of the abdomen and pelvis was
performed following the standard protocol during bolus
administration of intravenous contrast. Sagittal and coronal MPR
images reconstructed from axial data set.
Contrast: 100mL OMNIPAQUE IOHEXOL 300 MG/ML  SOLN Dilute oral
contrast.

[Series 2: rtn ap with st · axial · 0.82mm/px · z∈[-512,-52]mm · 15 of 101 slices shown, 19 images]
[im 5/101  soft-tissue]
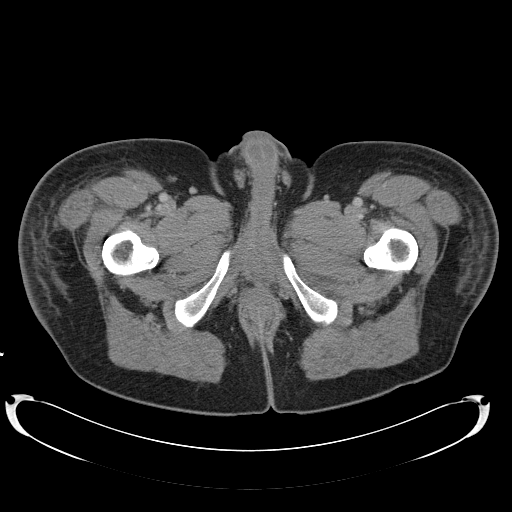
[im 5/101  bone]
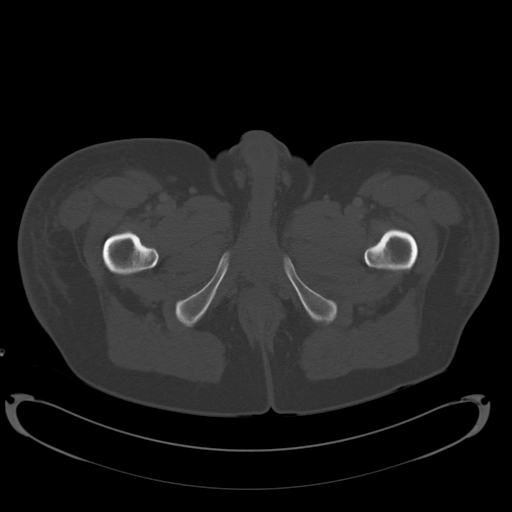
[im 13/101  soft-tissue]
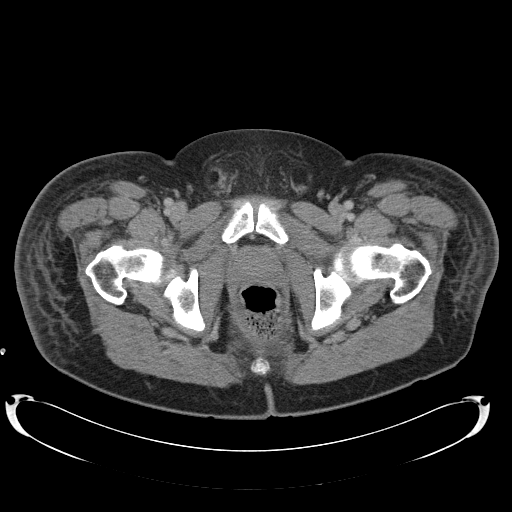
[im 21/101  soft-tissue]
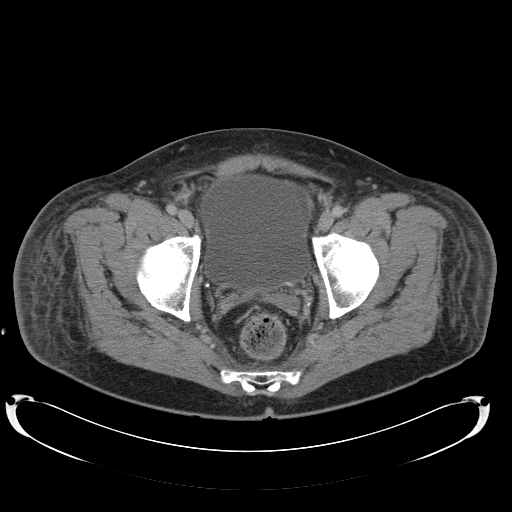
[im 29/101  soft-tissue]
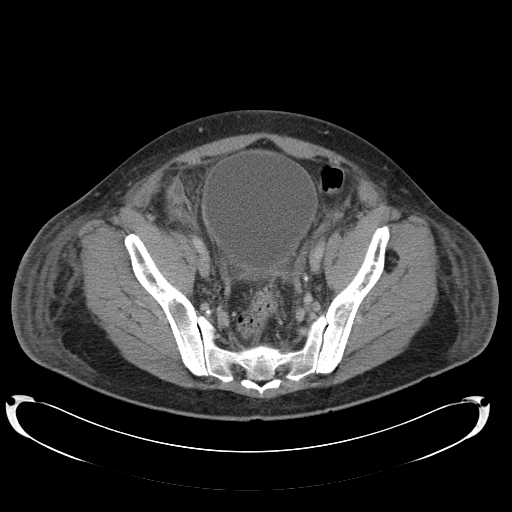
[im 37/101  soft-tissue]
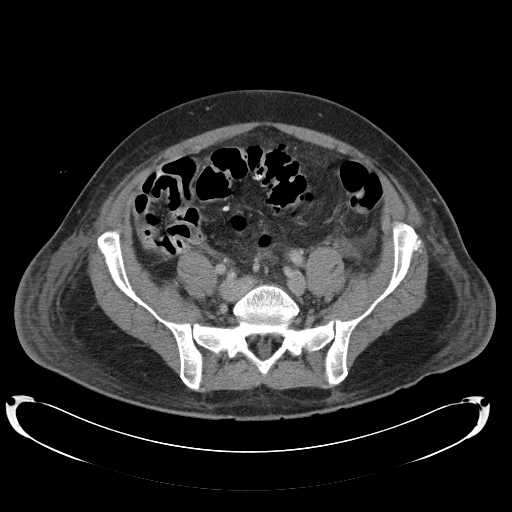
[im 45/101  soft-tissue]
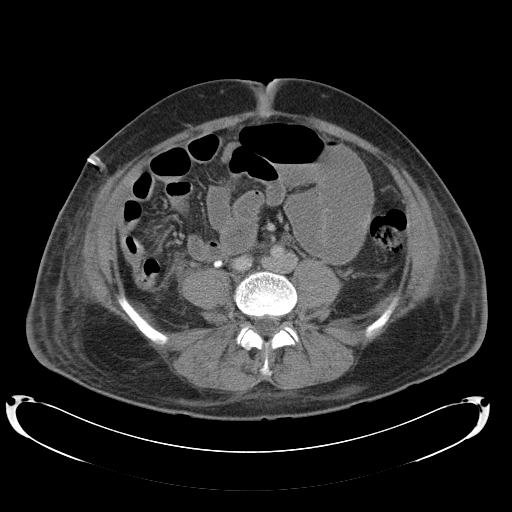
[im 53/101  soft-tissue]
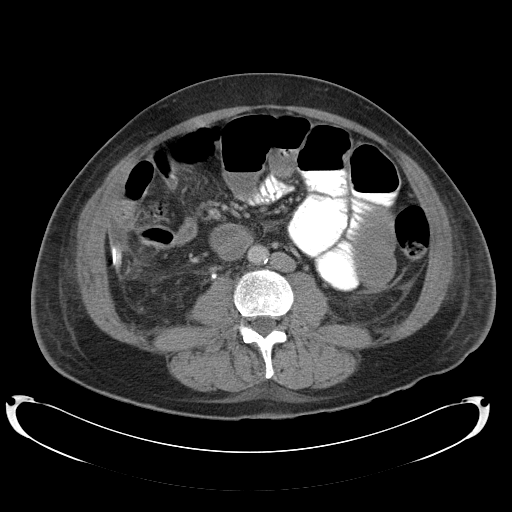
[im 57/101  soft-tissue]
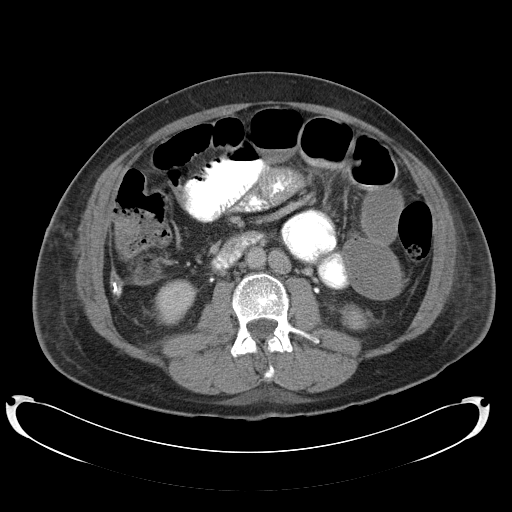
[im 65/101  soft-tissue]
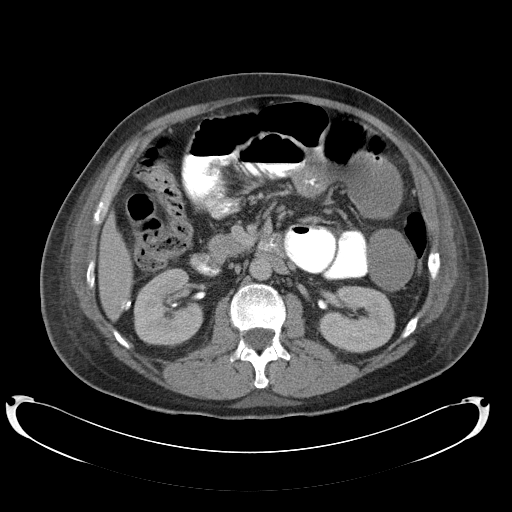
[im 65/101  bone]
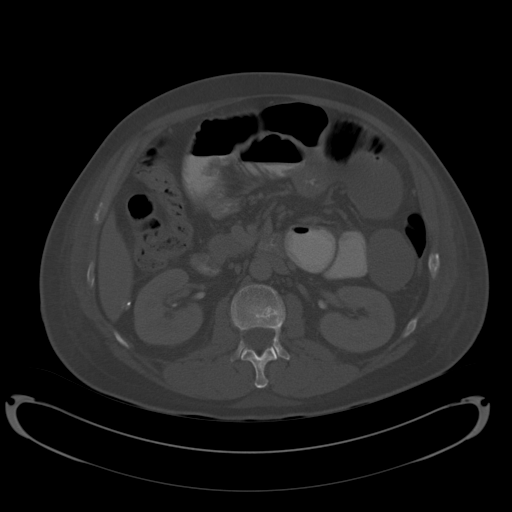
[im 73/101  soft-tissue]
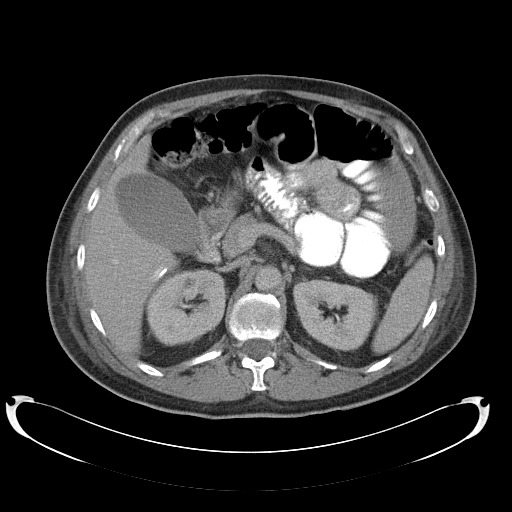
[im 81/101  soft-tissue]
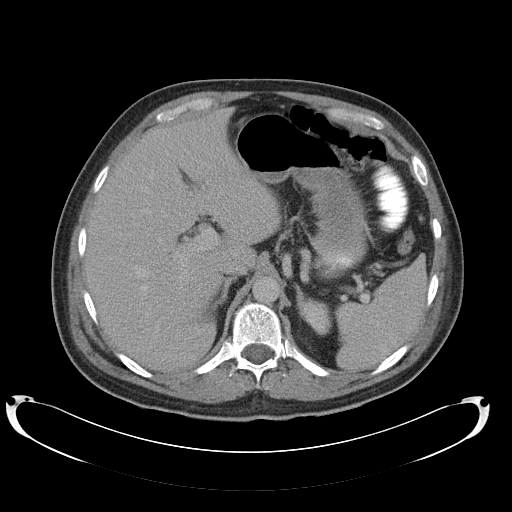
[im 85/101  lung]
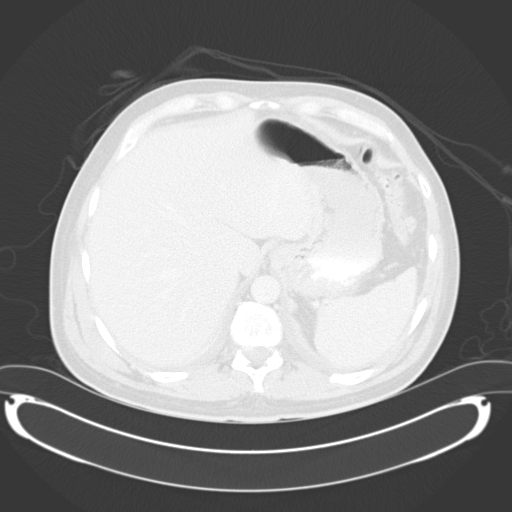
[im 89/101  soft-tissue]
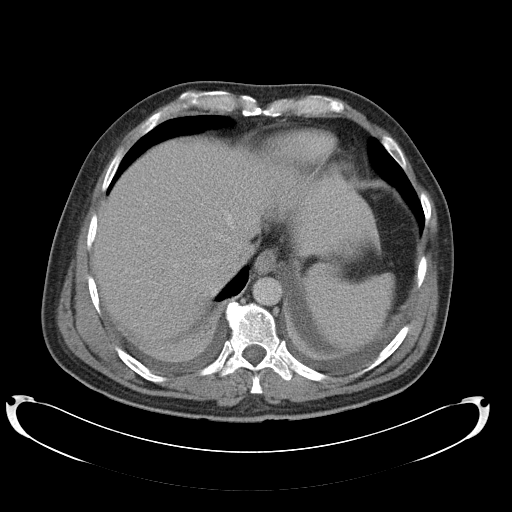
[im 89/101  lung]
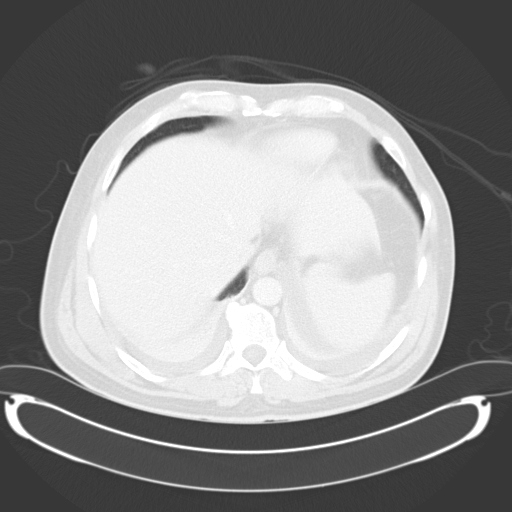
[im 93/101  lung]
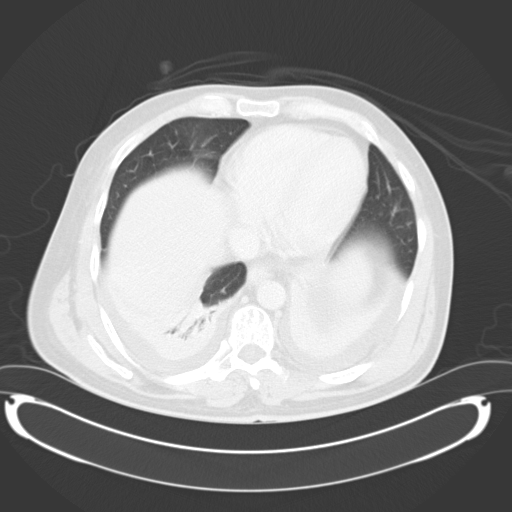
[im 97/101  soft-tissue]
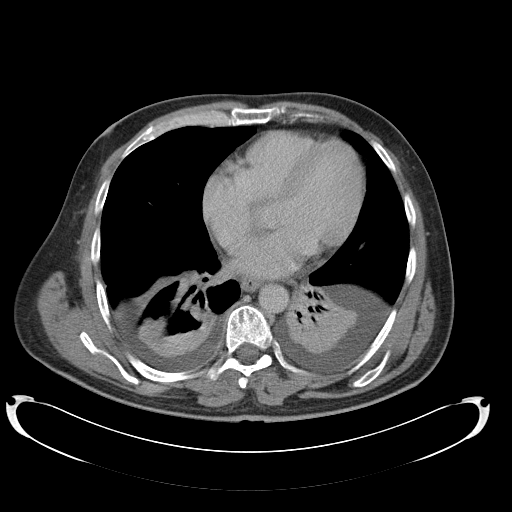
[im 97/101  lung]
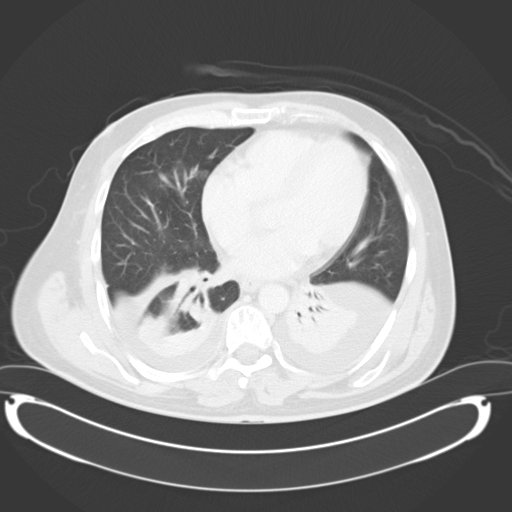

[15 of 32 positions shown; findings below may reference images not displayed]

FINDINGS: Small bibasilar pleural effusions with atelectasis in both lower
lobes.
Small right renal cyst.
Liver, spleen, pancreas, kidneys, and adrenal glands otherwise
normal.
Surgical drain identified in right lower quadrant extending to sub
hepatic space.
Interval appendectomy.
Scattered stranding of tissue planes in right pelvis/right lower
quadrant identified.

Small focal fluid collection is identified in the right lower
quadrant, 2.5 x 2.5 x 1.8 cm, could represent a sterile or infected
postoperative collection.
Additional tiny lenticular fluid collection in right upper pelvis,
slightly more inferior to the larger collection may represent an
adjacent versus contiguous postoperative fluid collection, 17 x 9 x
11 mm.
Small dependent fluid collection in pelvis between bladder and
rectum, with questionable enhancing margins, 3.6 x 1.6 1.4 cm,
cannot exclude additional abscess though this could represent
dependent free pelvic fluid, extending slightly to the left.

Thickened distal small bowel loops compatible with inflammatory
process and surgery.
Small lipoma within left gluteus minimus muscle.
Dilated small bowel loops with decompressed colon question
postoperative ileus.
Unremarkable bladder and ureters.
Diverticulosis of sigmoid colon.
No mass, adenopathy, free air, hernia, or acute bony lesion.
IMPRESSION: Post appendectomy.
Small fluid collections in pelvis could represent sterile or
infected postoperative collections, including a right pelvic
collection 2.5 cm in greatest size and a dependent pelvic
collection measuring 3.6 cm greatest size.
Suspect postoperative ileus.
Bibasilar pleural effusions atelectasis.
